# Patient Record
Sex: Male | Born: 1960 | Race: Black or African American | Hispanic: No | Smoking: Current every day smoker
Health system: Southern US, Community
[De-identification: ages and names within clinical notes are randomized; demographics above are authoritative.]

## PROBLEM LIST (undated history)

## (undated) DIAGNOSIS — F1010 Alcohol abuse, uncomplicated: Secondary | ICD-10-CM

## (undated) DIAGNOSIS — F1910 Other psychoactive substance abuse, uncomplicated: Secondary | ICD-10-CM

## (undated) HISTORY — PX: JOINT REPLACEMENT: SHX530

## (undated) HISTORY — PX: HIP OPEN REDUCTION: SHX1755

## (undated) HISTORY — PX: APPENDECTOMY: SHX54

---

## 2004-11-09 ENCOUNTER — Inpatient Hospital Stay (HOSPITAL_COMMUNITY): Admission: RE | Admit: 2004-11-09 | Discharge: 2004-11-13 | Payer: Self-pay | Admitting: Orthopedic Surgery

## 2012-05-13 ENCOUNTER — Inpatient Hospital Stay (HOSPITAL_COMMUNITY)
Admission: AD | Admit: 2012-05-13 | Discharge: 2012-05-20 | DRG: 897 | Disposition: A | Discharge status: Home / Self Care | Payer: Medicaid Other | Source: Ambulatory Visit | Attending: Psychiatry | Admitting: Psychiatry

## 2012-05-13 ENCOUNTER — Encounter (HOSPITAL_COMMUNITY): Payer: Self-pay | Admitting: Emergency Medicine

## 2012-05-13 ENCOUNTER — Emergency Department (HOSPITAL_COMMUNITY)
Admission: EM | Admit: 2012-05-13 | Discharge: 2012-05-13 | Disposition: A | Discharge status: Home / Self Care | Payer: Medicare Other | Attending: Emergency Medicine | Admitting: Emergency Medicine

## 2012-05-13 ENCOUNTER — Encounter (HOSPITAL_COMMUNITY): Payer: Self-pay

## 2012-05-13 DIAGNOSIS — F1220 Cannabis dependence, uncomplicated: Secondary | ICD-10-CM | POA: Diagnosis present

## 2012-05-13 DIAGNOSIS — F1020 Alcohol dependence, uncomplicated: Principal | ICD-10-CM | POA: Diagnosis present

## 2012-05-13 DIAGNOSIS — F1910 Other psychoactive substance abuse, uncomplicated: Secondary | ICD-10-CM | POA: Insufficient documentation

## 2012-05-13 DIAGNOSIS — Z79899 Other long term (current) drug therapy: Secondary | ICD-10-CM

## 2012-05-13 DIAGNOSIS — F17200 Nicotine dependence, unspecified, uncomplicated: Secondary | ICD-10-CM | POA: Insufficient documentation

## 2012-05-13 DIAGNOSIS — F329 Major depressive disorder, single episode, unspecified: Secondary | ICD-10-CM | POA: Diagnosis present

## 2012-05-13 DIAGNOSIS — R45850 Homicidal ideations: Secondary | ICD-10-CM

## 2012-05-13 DIAGNOSIS — F1210 Cannabis abuse, uncomplicated: Secondary | ICD-10-CM | POA: Insufficient documentation

## 2012-05-13 HISTORY — DX: Alcohol abuse, uncomplicated: F10.10

## 2012-05-13 HISTORY — DX: Other psychoactive substance abuse, uncomplicated: F19.10

## 2012-05-13 LAB — COMPREHENSIVE METABOLIC PANEL
Alkaline Phosphatase: 77 U/L (ref 39–117)
BUN: 10 mg/dL (ref 6–23)
CO2: 23 mEq/L (ref 19–32)
Calcium: 9.4 mg/dL (ref 8.4–10.5)
GFR calc Af Amer: 84 mL/min — ABNORMAL LOW (ref >90)
Glucose, Bld: 91 mg/dL (ref 70–99)
Sodium: 135 mEq/L (ref 135–145)
Total Bilirubin: 0.5 mg/dL (ref 0.3–1.2)
Total Protein: 7.4 g/dL (ref 6.0–8.3)

## 2012-05-13 LAB — RAPID URINE DRUG SCREEN, HOSP PERFORMED
Amphetamines: NOT DETECTED
Benzodiazepines: NOT DETECTED
Opiates: NOT DETECTED

## 2012-05-13 LAB — CBC
MCH: 31.1 pg (ref 26.0–34.0)
MCHC: 35.6 g/dL (ref 30.0–36.0)
Platelets: 231 10*3/uL (ref 150–400)

## 2012-05-13 LAB — ACETAMINOPHEN LEVEL: Acetaminophen (Tylenol), Serum: <15 ug/mL (ref 10–30)

## 2012-05-13 MED ORDER — CHLORDIAZEPOXIDE HCL 25 MG PO CAPS
25.0000 mg | ORAL_CAPSULE | Freq: Four times a day (QID) | ORAL | Status: AC
  Administered 2012-05-13 – 2012-05-15 (×6): 25 mg via ORAL
  Filled 2012-05-13 (×5): qty 1

## 2012-05-13 MED ORDER — CHLORDIAZEPOXIDE HCL 25 MG PO CAPS
25.0000 mg | ORAL_CAPSULE | Freq: Once | ORAL | Status: AC
  Administered 2012-05-13: 25 mg via ORAL

## 2012-05-13 MED ORDER — ZOLPIDEM TARTRATE 5 MG PO TABS: 5.0000 mg | ORAL_TABLET | Freq: Every evening | ORAL | Status: DC | PRN

## 2012-05-13 MED ORDER — LORAZEPAM 1 MG PO TABS: 1.0000 mg | ORAL_TABLET | Freq: Three times a day (TID) | ORAL | Status: DC | PRN

## 2012-05-13 MED ORDER — THIAMINE HCL 100 MG/ML IJ SOLN
100.0000 mg | Freq: Once | INTRAMUSCULAR | Status: AC
  Administered 2012-05-13: 100 mg via INTRAMUSCULAR

## 2012-05-13 MED ORDER — CHLORDIAZEPOXIDE HCL 25 MG PO CAPS
25.0000 mg | ORAL_CAPSULE | Freq: Three times a day (TID) | ORAL | Status: AC
  Administered 2012-05-15 – 2012-05-16 (×2): 25 mg via ORAL
  Filled 2012-05-13 (×2): qty 1

## 2012-05-13 MED ORDER — VITAMIN B-1 100 MG PO TABS
100.0000 mg | ORAL_TABLET | Freq: Every day | ORAL | Status: DC
  Administered 2012-05-14 – 2012-05-20 (×7): 100 mg via ORAL
  Filled 2012-05-13 (×8): qty 1

## 2012-05-13 MED ORDER — CHLORDIAZEPOXIDE HCL 25 MG PO CAPS
ORAL_CAPSULE | ORAL | Status: AC
  Administered 2012-05-13: 25 mg via ORAL
  Filled 2012-05-13: qty 1

## 2012-05-13 MED ORDER — ONDANSETRON 4 MG PO TBDP
4.0000 mg | ORAL_TABLET | Freq: Four times a day (QID) | ORAL | Status: AC | PRN
  Administered 2012-05-14 – 2012-05-15 (×2): 4 mg via ORAL
  Filled 2012-05-13: qty 1

## 2012-05-13 MED ORDER — MAGNESIUM HYDROXIDE 400 MG/5ML PO SUSP: 30.0000 mL | Freq: Every day | ORAL | Status: DC | PRN

## 2012-05-13 MED ORDER — TRAZODONE HCL 50 MG PO TABS
50.0000 mg | ORAL_TABLET | Freq: Every evening | ORAL | Status: DC | PRN
  Administered 2012-05-13 – 2012-05-15 (×3): 50 mg via ORAL
  Filled 2012-05-13 (×9): qty 1

## 2012-05-13 MED ORDER — LOPERAMIDE HCL 2 MG PO CAPS: 2.0000 mg | ORAL_CAPSULE | ORAL | Status: AC | PRN

## 2012-05-13 MED ORDER — HYDROXYZINE HCL 25 MG PO TABS
25.0000 mg | ORAL_TABLET | Freq: Four times a day (QID) | ORAL | Status: AC | PRN
  Administered 2012-05-16: 25 mg via ORAL

## 2012-05-13 MED ORDER — ALUM & MAG HYDROXIDE-SIMETH 200-200-20 MG/5ML PO SUSP: 30.0000 mL | ORAL | Status: DC | PRN

## 2012-05-13 MED ORDER — ACETAMINOPHEN 325 MG PO TABS
650.0000 mg | ORAL_TABLET | Freq: Four times a day (QID) | ORAL | Status: DC | PRN
  Administered 2012-05-14 – 2012-05-18 (×2): 650 mg via ORAL

## 2012-05-13 MED ORDER — CHLORDIAZEPOXIDE HCL 25 MG PO CAPS
ORAL_CAPSULE | ORAL | Status: AC
  Filled 2012-05-13: qty 1

## 2012-05-13 MED ORDER — CHLORDIAZEPOXIDE HCL 25 MG PO CAPS
25.0000 mg | ORAL_CAPSULE | Freq: Every day | ORAL | Status: AC
  Administered 2012-05-15 – 2012-05-18 (×2): 25 mg via ORAL

## 2012-05-13 MED ORDER — CHLORDIAZEPOXIDE HCL 25 MG PO CAPS
25.0000 mg | ORAL_CAPSULE | ORAL | Status: AC
  Administered 2012-05-16 – 2012-05-17 (×2): 25 mg via ORAL
  Filled 2012-05-13 (×3): qty 1

## 2012-05-13 MED ORDER — ONDANSETRON HCL 4 MG PO TABS: 4.0000 mg | ORAL_TABLET | Freq: Three times a day (TID) | ORAL | Status: DC | PRN

## 2012-05-13 MED ORDER — ADULT MULTIVITAMIN W/MINERALS CH
1.0000 | ORAL_TABLET | Freq: Every day | ORAL | Status: DC
  Administered 2012-05-14 – 2012-05-20 (×7): 1 via ORAL
  Filled 2012-05-13 (×8): qty 1

## 2012-05-13 MED ORDER — NICOTINE 21 MG/24HR TD PT24
21.0000 mg | MEDICATED_PATCH | Freq: Every day | TRANSDERMAL | Status: DC
  Administered 2012-05-14 – 2012-05-18 (×5): 21 mg via TRANSDERMAL
  Filled 2012-05-13 (×8): qty 1

## 2012-05-13 NOTE — Tx Team (Signed)
Initial Interdisciplinary Treatment Plan  PATIENT STRENGTHS: (choose at least two) Ability for insight Average or above average intelligence Communication skills General fund of knowledge Motivation for treatment/growth  PATIENT STRESSORS: Substance abuse   PROBLEM LIST: Problem List/Patient Goals Date to be addressed Date deferred Reason deferred Estimated date of resolution  Substance Abuse      Homicidal Ideation                                                 DISCHARGE CRITERIA:  Motivation to continue treatment in a less acute level of care  PRELIMINARY DISCHARGE PLAN: Outpatient therapy  PATIENT/FAMIILY INVOLVEMENT: This treatment plan has been presented to and reviewed with the patient, William Cooper, and/or family member.  The patient and family have been given the opportunity to ask questions and make suggestions.  Gretta Arab [facility] 05/13/2012, 10:35 PM

## 2012-05-13 NOTE — BH Assessment (Signed)
Assessment Note   William Cooper is an 51 y.o. male who presents to the ED requesting detox. Patient shared that he is ready for detox and treatment. CSW met with patient at bedside. Pt was very lethargic and sleepy. Pt was alert and oriented x 4, however pt did not open his eyes during assessment. Pt was not fully engaged in conversation and replied "I don't know to a lot of questions".   Pt shared that he uses alcohol, cocaine, and weed on a daily basis, and has so for months. Pt stated he was sober for 5-7 years. Pt shared that he began using drugs again when he returned to selling. Pt shared this morning he drank 6 beers, less than a gram of crack cocaine, and a blunt of marijuana. Pt states this is a usual amount, sometimes more depending.   Pt denies SI, HI, AH, VH. Pt reported history of SI and HI in years past but denies at this time.  Pt shares he has a history of depression and at what time was prescribed Wellbutrin. Pt states he has not been compliant with medications of follow up care from a psychiatrist in years. Pt stated he doesn't remember who his provider was. Pt stated it been years ago that he took medication. Pt reports trouble sleeping, but no further symptoms of depression. Pt shares he has a history of depression and a suicide attempt by overdosing on drugs a few years ago. Pt reports that pt received inpatient treatment at [facility] and a few other places but pt can not recall.   Pt shares he lives in an apartment and currently receives SSI for a back injury. Pt shares he doesn't have any family or friends as a support.   Axis I: Polysubstance Dependence  Axis II: Deferred Axis III:  Past Medical History  Diagnosis Date  . Alcohol abuse   . Substance abuse    Axis IV: other psychosocial or environmental problems and problems related to social environment Axis V: 41-50 serious symptoms  Past Medical History:  Past Medical History  Diagnosis Date  . Alcohol  abuse   . Substance abuse     Past Surgical History  Procedure Date  . Joint replacement     Family History: History reviewed. No pertinent family history.  Social History:  reports that he has been smoking Cigarettes.  He has been smoking about .5 packs per day. He does not have any smokeless tobacco history on file. He reports that he drinks alcohol. He reports that he uses illicit drugs (Marijuana and Cocaine).  Additional Social History:  Alcohol / Drug Use History of alcohol / drug use?: Yes Substance #1 Name of Substance 1: Alcohol  1 - Age of First Use: 31-8 years old  1 - Amount (size/oz): 6 beers or more  1 - Frequency: daily  1 - Duration: months 1 - Last Use / Amount: 6 beers this morning  Substance #2 Name of Substance 2: crack  2 - Age of First Use: 20's  2 - Amount (size/oz): less than a gram sometimes more  2 - Frequency: daily  2 - Duration: months 2 - Last Use / Amount: less than a gram this morning  Substance #3 Name of Substance 3: THC  3 - Age of First Use: [age] years old 3 - Amount (size/oz): blunt 3 - Frequency: daily 3 - Duration: months 3 - Last Use / Amount: this morning, blunt   CIWA: CIWA-Ar BP: 155/95  mmHg Pulse Rate: 58  COWS:    Allergies:  Allergies  Allergen Reactions  . Morphine And Related Rash    Home Medications:  (Not in a hospital admission)  OB/GYN Status:  No LMP for male patient.  General Assessment Data Location of Assessment: [facility] ED Living Arrangements: Alone Can pt return to current living arrangement?: Yes Admission Status: Voluntary Is patient capable of signing voluntary admission?: Yes Transfer from: Home Referral Source: Self/Family/Friend  Education Status Is patient currently in school?: No  Risk to self Suicidal Ideation: No Suicidal Intent: No Is patient at risk for suicide?: No What has been your use of drugs/alcohol within the last 12 months?:  (uses drugs and alcohol daily ) Previous  Attempts/Gestures: Yes (years ago ) How many times?: 1  Triggers for Past Attempts: Unknown Intentional Self Injurious Behavior: None Family Suicide History: Unknown Recent stressful life event(s):  ("Idk everything" ) Persecutory voices/beliefs?: No Depression: Yes Depression Symptoms: Insomnia;Feeling angry/irritable Substance abuse history and/or treatment for substance abuse?: Yes  Risk to Others Homicidal Ideation: No-Not Currently/Within Last 6 Months Thoughts of Harm to Others: No-Not Currently Present/Within Last 6 Months Current Homicidal Intent: No-Not Currently/Within Last 6 Months Identified Victim: n/a  History of harm to others?:  (has had HI thoughts in the past ) Assessment of Violence: None Noted Violent Behavior Description: none Does patient have access to weapons?: Yes (Comment) Criminal Charges Pending?: No Does patient have a court date: No  Psychosis Hallucinations: None noted Delusions: None noted  Mental Status Report Appear/Hygiene: Other (Comment) (very sleepy and lethargic) Eye Contact: Poor (patient did not open eyes during assessment ) Motor Activity: Unremarkable Speech: Soft;Slow Level of Consciousness: Quiet/awake Mood: Apathetic Affect: Apathetic Anxiety Level: Minimal Thought Processes: Coherent;Relevant Judgement: Impaired Orientation: Person;Place;Time;Situation Obsessive Compulsive Thoughts/Behaviors: None  Cognitive Functioning Concentration: Normal Memory: Recent Intact;Remote Intact IQ: Average Insight: Poor Impulse Control: Poor Appetite: Poor Weight Loss: 15  Sleep: Decreased Total Hours of Sleep: 2  Vegetative Symptoms: None  ADLScreening [facility]) Patient's cognitive ability adequate to safely complete daily activities?: Yes Patient able to express need for assistance with ADLs?: Yes Independently performs ADLs?: Yes (appropriate for developmental age)  Abuse/Neglect [facility]) Physical Abuse:  Denies Verbal Abuse: Denies Sexual Abuse: Denies  Prior Inpatient Therapy Prior Inpatient Therapy: Yes Prior Therapy Dates: uknown Prior Therapy Facilty/Provider(s): [facility], other, IDK  Reason for Treatment: substance abuse/ depression   Prior Outpatient Therapy Prior Outpatient Therapy: Yes Prior Therapy Dates: unknown Prior Therapy Facilty/Provider(s): not current  Reason for Treatment: depression  ADL Screening (condition at time of admission) Patient's cognitive ability adequate to safely complete daily activities?: Yes Patient able to express need for assistance with ADLs?: Yes Independently performs ADLs?: Yes (appropriate for developmental age)       Abuse/Neglect Assessment (Assessment to be complete while patient is alone) Physical Abuse: Denies Verbal Abuse: Denies Sexual Abuse: Denies Values / Beliefs Cultural Requests During Hospitalization: None Spiritual Requests During Hospitalization: None        Additional Information 1:1 In Past 12 Months?: No CIRT Risk: No Elopement Risk: No Does patient have medical clearance?: Yes     Disposition:  Disposition Disposition of Patient: Inpatient treatment program  On Site Evaluation by:   Reviewed with Physician:     Catha Gosselin A 05/13/2012 7:57 PM

## 2012-05-13 NOTE — ED Notes (Signed)
Pt alert and oriented x4. Respirations even and unlabored, bilateral symmetrical rise and fall of chest. Skin warm and dry. In no acute distress. Denies needs.   

## 2012-05-13 NOTE — ED Notes (Signed)
Pt vitals taken at arrival and within normal limits - did not appear in computer.

## 2012-05-13 NOTE — ED Notes (Signed)
Pt requesting detox from "alcohol, weed, crack, pills, and women".  Last used all of them together today "this morning".

## 2012-05-13 NOTE — Progress Notes (Signed)
Pt accepted to [facility] 301-2 to Dr. Dub Mikes. Pt RN and EDP informed. CSW completed support paperwork and sent to [facility].   Catha Gosselin, LCSWA  [phone] .05/13/2012 8:51pm

## 2012-05-13 NOTE — Progress Notes (Signed)
Patient ID: William Cooper, male   DOB: 1960-08-15, 51 y.o.   MRN: [id number] Pt denies SI/AVH. Pt endorses HI with no plan, but refuses to comment on who the HI is toward. When asked about the HI he states "that's why I am here." Pt admitted voluntarily today for ETOH detox. Pt states that he has been drinking since he was a teenager, however he has had moments of sobriety with his longest time being 7 years.  Pt states that he relapsed 1 year ago and currently drinks 6 beers daily. Pt irritable on admit. Pt states that his mother and brother both passed away in 11/13/10 within 2 months of each other. Pt states that for the past 2 weeks he has been experiencing blood in his stool. Pt also states that over the past month he has lost 15 pounds due to poor appetite. Pt is disabled. UDS positive for THC. BAL was 19.

## 2012-05-13 NOTE — ED Provider Notes (Signed)
History     CSN: [id number] Arrival date & time 05/13/12  1551 First MD Initiated Contact with Patient 05/13/12 1756    Chief Complaint  Patient presents with  . Medical Clearance   HPI The patient presents to the emergency room requesting alcohol detox. Patient states he drinks 7 or 8 beers per day. He also smokes marijuana and uses crack. He also takes Xanax. Patient states she spoke to a center and was told to come to emergency room to be cleared. He denies any suicidal or homicidal ideation. Patient states he's been drinking like this for a long period of time.  He currently does not work. He resides in [place]. Past Medical History  Diagnosis Date  . Alcohol abuse   . Substance abuse     Past Surgical History  Procedure Date  . Joint replacement     History reviewed. No pertinent family history.  History  Substance Use Topics  . Smoking status: Current Every Day Smoker -- 0.5 packs/day    Types: Cigarettes  . Smokeless tobacco: Not on file  . Alcohol Use: Yes     Comment: 3-4 beers a day      Review of Systems  All other systems reviewed and are negative.    Allergies  Morphine and related  Home Medications  No current outpatient prescriptions on file.  BP 155/95  Pulse 58  Temp 98.6 F (37 C) (Oral)  Resp 18  SpO2 100%  Physical Exam  Nursing note and vitals reviewed. Constitutional: He appears well-developed and well-nourished. No distress.  HENT:  Head: Normocephalic and atraumatic.  Right Ear: External ear normal.  Left Ear: External ear normal.  Eyes: Conjunctivae normal are normal. Right eye exhibits no discharge. Left eye exhibits no discharge. No scleral icterus.  Neck: Neck supple. No tracheal deviation present.  Cardiovascular: Normal rate, regular rhythm and intact distal pulses.   Pulmonary/Chest: Effort normal and breath sounds normal. No stridor. No respiratory distress. He has no wheezes. He has no rales.  Abdominal: Soft. Bowel  sounds are normal. He exhibits no distension. There is no tenderness. There is no rebound and no guarding.  Musculoskeletal: He exhibits no edema and no tenderness.  Neurological: He is alert. He has normal strength. No sensory deficit. Cranial nerve deficit:  no gross defecits noted. He exhibits normal muscle tone. He displays no seizure activity. Coordination normal.  Skin: Skin is warm and dry. No rash noted.  Psychiatric: He has a normal mood and affect.    ED Course  Procedures (including critical care time)  Labs Reviewed  COMPREHENSIVE METABOLIC PANEL - Abnormal; Notable for the following:    GFR calc non Af Amer 72 (*)     GFR calc Af Amer 84 (*)     All other components within normal limits  ETHANOL - Abnormal; Notable for the following:    Alcohol, Ethyl (B) 19 (*)     All other components within normal limits  SALICYLATE LEVEL - Abnormal; Notable for the following:    Salicylate Lvl <2.0 (*)     All other components within normal limits  URINE RAPID DRUG SCREEN (HOSP PERFORMED) - Abnormal; Notable for the following:    Tetrahydrocannabinol POSITIVE (*)     All other components within normal limits  CBC  ACETAMINOPHEN LEVEL   No results found.    MDM   The patient is hemodynamically stable in the ED. He is medically cleared. We'll attempt to assist him with  getting treatment for his substance abuse.       Celene Kras, MD 05/13/12 [phone]

## 2012-05-13 NOTE — ED Notes (Signed)
Pt in blue scrubs - wanded - requested urine, pt said not able to produce at this time - belongings up front in triage: 1 rolling duffle bag(full), 1 bag with tee shirt, hat, underpants, shoes, jeans.

## 2012-05-14 ENCOUNTER — Encounter (HOSPITAL_COMMUNITY): Payer: Self-pay | Admitting: Psychiatry

## 2012-05-14 DIAGNOSIS — F1010 Alcohol abuse, uncomplicated: Secondary | ICD-10-CM

## 2012-05-14 DIAGNOSIS — F1020 Alcohol dependence, uncomplicated: Secondary | ICD-10-CM | POA: Diagnosis present

## 2012-05-14 DIAGNOSIS — F1910 Other psychoactive substance abuse, uncomplicated: Secondary | ICD-10-CM

## 2012-05-14 DIAGNOSIS — F329 Major depressive disorder, single episode, unspecified: Secondary | ICD-10-CM | POA: Diagnosis present
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
## 2012-05-14 NOTE — Progress Notes (Signed)
Patient ID: William Cooper, male   DOB: 10-Sep-1960, 51 y.o.   MRN: [id number] He has been in bed in AM and was up and to groups this afternoon. He refused to fill out his self inventory and has not  c/o withdrawal symptoms nor has he said anything about there being blood in his stool today.

## 2012-05-14 NOTE — Progress Notes (Signed)
Psychoeducational Group Note  Date:  05/14/2012 Time: 2000 Group Topic/Focus:  AA group  Participation Level:  Active  Participation Quality:  Appropriate  Affect:  Appropriate  Cognitive:  Appropriate  Insight:  Good  Engagement in Group:  Good  Additional Comments:    William Cooper 05/14/2012, 10:35 PM

## 2012-05-14 NOTE — Progress Notes (Signed)
Nutrition Brief Note  Patient identified on the Malnutrition Screening Tool (MST) Report  Body mass index is 30.21 kg/(m^2). Pt meets criteria for overweight based on current BMI.   Current diet order is Regular, patient is consuming <50% of meals at this time per his report. Labs and medications reviewed.   Pt reports diarrhea.  He recently had a bleeding ulcer and his intake has been poor since due to diarrhea.  Pt was started on lomotil this am. Encouraged small frequent meals and beverages to prevent dehydration.  No additional nutrition interventions warranted at this time. If nutrition issues arise, please consult RD.   Loyce Dys, MS RD LDN Clinical Inpatient Dietitian Pager: [phone] Weekend/After hours pager: [phone]
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
## 2012-05-14 NOTE — Clinical Social Work Note (Signed)
Aftercare Planning Group: 05/14/2012 9:45 AM  Pt attended discharge planning group and actively participated in group.  CSW provided pt with today's workbook.  Pt presents with flat affect and depressed mood.  Pt states that he came to the hospital because he was having thoughts of hurting someone.  Pt states that he uses alcohol and marijuana and wants to go to long term treatment.  CSW will assess for appropriate referrals.  Pt states that he lives in [place] alone.  No further needs voiced by pt at this time.    [facility] Group Note : Clinical Social Worker Group Therapy  05/14/2012  1:15 PM  Type of Therapy:  Group Therapy - Process Group  Participation Level:  Appropriate  Participation Quality:  Appropriate   Affect:  Anxious, agitated  Cognitive:  Alert  Insight:  Limited, superficial  Engagement in Group:  Limited  Engagement in Therapy:  Limited  Modes of Intervention:  Support  Summary of Progress/Problems: The topic of today's group was emotional regulation.  Patient was active participant in group.  Discussed wanting to get help for himself and not expecting others to do it for him.  Patient discussed the importance of taking responsibility for one's own sobriety.  Chelsea Horton, LCSWA 05/14/2012 3:00 pm y
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
## 2012-05-14 NOTE — Tx Team (Signed)
Interdisciplinary Treatment Plan Update (Adult)  Date:  05/14/2012  Time Reviewed:  10:05 AM   Progress in Treatment: Attending groups: Yes Participating in groups:  Yes Taking medication as prescribed: Yes Tolerating medication:  Yes Family/Significant othe contact made: No, pt refused Patient understands diagnosis:  Yes Discussing patient identified problems/goals with staff:  Yes Medical problems stabilized or resolved:  Yes Denies suicidal/homicidal ideation: Yes Issues/concerns per patient self-inventory:  None identified Other: N/A  New problem(s) identified: None Identified  Reason for Continuation of Hospitalization: Anxiety Depression Medication stabilization  Interventions implemented related to continuation of hospitalization: mood stabilization, medication monitoring and adjustment, group therapy and psycho education, suicide risk assessment, collateral contact, aftercare planning, ongoing physician assessments and safety checks q 15 mins  Additional comments: N/A  Estimated length of stay: 3-5 days  Discharge Plan: CSW is assessing for appropriate referrals for long term treatment.    New goal(s): N/A  Review of initial/current patient goals per problem list:    1.  Goal(s): Address substance use by completing detox protocol  Met:  No  Target date: 4 days  As evidenced by: 05/19/12  2.  Goal (s): Reduce depressive symptoms from a 10 to a 3  Met:  No  Target date: 3-5 days  As evidenced by: Pt rates at a   3.  Goal (s): Reduce anxiety symptoms from a 10 to a 3  Met:  No  Target date:  3-5 days  As evidenced by: Pt rates at a     Attendees: Patient:     Family:     Physician: Geoffery Lyons, MD 05/14/2012 10:05 AM   Nursing: Roswell Miners, RN 05/14/2012 10:05 AM   Clinical Social Worker:  Reyes Ivan, LCSWA 05/14/2012  10:05 AM   Other: Burnetta Sabin, RN 05/14/2012  10:05 AM   Other:  Nanine Means, NP 05/14/2012 10:07 AM   Other:  Bubba Camp, Psyc intern 05/14/2012 10:07 AM   Other:     Other:      Scribe for Treatment Team:   Reyes Ivan 05/14/2012 10:05 AM

## 2012-05-15 NOTE — Progress Notes (Signed)
Patient ID: William Cooper, male   DOB: 08-31-1960, 51 y.o.   MRN: [id number] He has been up and to group interacting with peers. Self inventory:Denies depression Not hopeless, denies SI thoughts.

## 2012-05-15 NOTE — Progress Notes (Signed)
[facility] MD Progress Note  05/15/2012 11:36 AM William Cooper  MRN:  [id number]  Diagnosis:  Alcohol Dependence, Mood Disorder NOS, Impulse Control NOS  ADL's:  Intact  Sleep: Poor  Appetite:  Fair  Suicidal Ideation:  Plan:  get in a situation when he kills these guys andbe killed Intent:  Denies Means:  Denies Homicidal Ideation:  Plan:  GO and get them Intent:  Denies Means:  Denies  Having a hard time. He woke up with a dream in which he was "chasing these boys." He admits he is very angry. In the dream he relieved the event and saw himself shooting them. Does not want to hurt anyone, and afraid he is going to do it. Wants help to deal with his temper Usually the temper is there but he can control it, when he drinks he cant control it. Admits he gets frustrated a lot. It was easier to walk away not now specially when he is using  Mental Status Examination/Evaluation: Objective:  Appearance: Fairly Groomed and Guarded  Eye Contact::  Minimal  Speech:  Clear and Coherent and Slow  Volume:  Decreased  Mood:  Anxious, Depressed and Irritable  Affect:  Restricted  Thought Process:  Coherent and Goal Directed  Orientation:  Full  Thought Content:  Rumination  Suicidal Thoughts:  Yes.  with intent/plan  Homicidal Thoughts:  Yes.  with intent/plan  Memory:  Immediate;   Fair Recent;   Fair Remote;   Fair  Judgement:  Impaired  Insight:  Shallow  Psychomotor Activity:  Normal  Concentration:  Fair  Recall:  Fair  Akathisia:  No  Handed:  Right  AIMS (if indicated):     Assets:  Desire for Improvement  Sleep:  Number of Hours: 6.5    Vital Signs:Blood pressure 126/82, pulse 80, temperature 97.8 F (36.6 C), temperature source Oral, resp. rate 18, height 5\' 4"  (1.626 m), weight 79.833 kg (176 lb). Current Medications: Current Facility-Administered Medications  Medication Dose Route Frequency Provider Last Rate Last Dose  . acetaminophen (TYLENOL) tablet 650 mg  650 mg Oral  Q6H PRN Kerry Hough, PA   650 mg at 05/14/12 1314  . [COMPLETED] chlordiazePOXIDE (LIBRIUM) capsule 25 mg  25 mg Oral QID Kerry Hough, PA   25 mg at 05/15/12 0802   Followed by  . chlordiazePOXIDE (LIBRIUM) capsule 25 mg  25 mg Oral TID Kerry Hough, PA       Followed by  . chlordiazePOXIDE (LIBRIUM) capsule 25 mg  25 mg Oral BH-qamhs Kerry Hough, PA       Followed by  . chlordiazePOXIDE (LIBRIUM) capsule 25 mg  25 mg Oral Daily Kerry Hough, PA      . hydrOXYzine (ATARAX/VISTARIL) tablet 25 mg  25 mg Oral Q6H PRN Kerry Hough, PA      . loperamide (IMODIUM) capsule 2-4 mg  2-4 mg Oral PRN Kerry Hough, PA      . magnesium hydroxide (MILK OF MAGNESIA) suspension 30 mL  30 mL Oral Daily PRN Kerry Hough, PA      . multivitamin with minerals tablet 1 tablet  1 tablet Oral Daily Kerry Hough, PA   1 tablet at 05/15/12 0802  . nicotine (NICODERM CQ - dosed in mg/24 hours) patch 21 mg  21 mg Transdermal Q0600 Kerry Hough, PA   21 mg at 05/15/12 0657  . ondansetron (ZOFRAN-ODT) disintegrating tablet 4 mg  4 mg Oral Q6H  PRN Kerry Hough, PA   4 mg at 05/14/12 0748  . thiamine (VITAMIN B-1) tablet 100 mg  100 mg Oral Daily Kerry Hough, PA   100 mg at 05/15/12 0802  . traZODone (DESYREL) tablet 50 mg  50 mg Oral QHS,MR X 1 Kerry Hough, PA   50 mg at 05/14/12 2109    Lab Results:  Results for orders placed during the hospital encounter of 05/13/12 (from the past 48 hour(s))  CBC     Status: Normal   Collection Time   05/13/12  4:55 PM      Component Value Range Comment   WBC 6.7  4.0 - 10.5 K/uL    RBC 5.05  4.22 - 5.81 MIL/uL    Hemoglobin 15.7  13.0 - 17.0 g/dL    HCT 21.3  08.6 - 57.8 %    MCV 87.3  78.0 - 100.0 fL    MCH 31.1  26.0 - 34.0 pg    MCHC 35.6  30.0 - 36.0 g/dL    RDW 46.9  62.9 - 52.8 %    Platelets 231  150 - 400 K/uL   COMPREHENSIVE METABOLIC PANEL     Status: Abnormal   Collection Time   05/13/12  4:55 PM      Component Value  Range Comment   Sodium 135  135 - 145 mEq/L    Potassium 3.8  3.5 - 5.1 mEq/L    Chloride 99  96 - 112 mEq/L    CO2 23  19 - 32 mEq/L    Glucose, Bld 91  70 - 99 mg/dL    BUN 10  6 - 23 mg/dL    Creatinine, Ser 4.13  0.50 - 1.35 mg/dL    Calcium 9.4  8.4 - 24.4 mg/dL    Total Protein 7.4  6.0 - 8.3 g/dL    Albumin 3.8  3.5 - 5.2 g/dL    AST 23  0 - 37 U/L    ALT 17  0 - 53 U/L    Alkaline Phosphatase 77  39 - 117 U/L    Total Bilirubin 0.5  0.3 - 1.2 mg/dL    GFR calc non Af Amer 72 (*) >90 mL/min    GFR calc Af Amer 84 (*) >90 mL/min   ETHANOL     Status: Abnormal   Collection Time   05/13/12  4:55 PM      Component Value Range Comment   Alcohol, Ethyl (B) 19 (*) 0 - 11 mg/dL   SALICYLATE LEVEL     Status: Abnormal   Collection Time   05/13/12  4:55 PM      Component Value Range Comment   Salicylate Lvl <2.0 (*) 2.8 - 20.0 mg/dL   ACETAMINOPHEN LEVEL     Status: Normal   Collection Time   05/13/12  4:55 PM      Component Value Range Comment   Acetaminophen (Tylenol), Serum <15.0  10 - 30 ug/mL   URINE RAPID DRUG SCREEN (HOSP PERFORMED)     Status: Abnormal   Collection Time   05/13/12  5:28 PM      Component Value Range Comment   Opiates NONE DETECTED  NONE DETECTED    Cocaine NONE DETECTED  NONE DETECTED    Benzodiazepines NONE DETECTED  NONE DETECTED    Amphetamines NONE DETECTED  NONE DETECTED    Tetrahydrocannabinol POSITIVE (*) NONE DETECTED    Barbiturates NONE DETECTED  NONE DETECTED  Physical Findings: AIMS: Facial and Oral Movements Muscles of Facial Expression: None, normal Lips and Perioral Area: None, normal Jaw: None, normal Tongue: None, normal,Extremity Movements Upper (arms, wrists, hands, fingers): None, normal Lower (legs, knees, ankles, toes): None, normal, Trunk Movements Neck, shoulders, hips: None, normal, Overall Severity Severity of abnormal movements (highest score from questions above): None, normal Incapacitation due to abnormal  movements: None, normal Patient's awareness of abnormal movements (rate only patient's report): No Awareness, Dental Status Current problems with teeth and/or dentures?: No Does patient usually wear dentures?: No  CIWA:  CIWA-Ar Total: 0  COWS:     Treatment Plan Summary: Daily contact with patient to assess and evaluate symptoms and progress in treatment Medication management  Plan: Complete detox           Discuss starting a mood stabilizer  Aniyah Nobis A 05/15/2012, 11:36 AM

## 2012-05-15 NOTE — Clinical Social Work Note (Signed)
Aftercare Planning Group: 05/15/2012 9:45 AM  Pt attended discharge planning group and actively participated in group.  CSW provided pt with today's workbook.  Pt presents with flat affect and depressed mood.  Pt rates depression and anxiety as high.  Pt states that he wants to go straight to treatment from here.  CSW suggested [facility] but pt refuses to go to [place].  Pt is unable to go to [facility] due to having [facility].  CSW will continue to assess for appropriate referrals.  No further needs voiced by pt at this time.  Safety planning and suicide prevention discussed.  Pt participated in discussion and acknowledged an understanding of the information provided.       [facility] Group Note : Clinical Social Worker Group Therapy  05/15/2012  1:15 PM  Type of Therapy:  Group Therapy - Process Group  Participation Level: monopolozing  Participation Quality:  Easily agitated  Affect:  Flat and Irritable  Cognitive:  Alert  Insight:  None  Engagement in Group:  Limited  Engagement in Therapy:  Limited  Modes of Intervention:  Support  Summary of Progress/Problems: The topic of today's group was maintaining balance.  Patient was irritable easily agitated. Minimized AA as alcohol was not his drug of choice.  Discussed need to relate to someone who had the experience of a narcotic in order to be able to help him.  Patient was resistant to the idea that an addiction is an addiction no matter what the drug.  Remained stuck in his past behaviors as a drug addict and was unable to discuss solutions.  Chelsea Horton, LCSWA 05/15/2012 3:00 pm

## 2012-05-15 NOTE — Progress Notes (Signed)
Patient ID: William Cooper, male   DOB: 1961-06-06, 51 y.o.   MRN: [id number] He has been up and to groups more today.  He denies  Thoughts of depression and SI.   Has been interacting with peers and staff.

## 2012-05-15 NOTE — Progress Notes (Signed)
Patient ID: William Cooper, male   DOB: 04-Dec-1960, 51 y.o.   MRN: [id number]  D:  Writer asked the pt how his hospital stay is going thus far. Pt stated, "Tired of these people telling me what to do".  Writer asked pt what made him decide to come for help. Pt stated, "Clean for 7 yrs and fucked up". Pt was walking away as he responded, as if to say he was finished talking.   A:  Offered support and encouragement.  15 min checks continued for safety.  R: Pt remains safe.

## 2012-05-16 MED ORDER — MIRTAZAPINE 30 MG PO TABS
30.0000 mg | ORAL_TABLET | Freq: Every day | ORAL | Status: DC
  Administered 2012-05-16 – 2012-05-17 (×2): 30 mg via ORAL
  Filled 2012-05-16 (×4): qty 1

## 2012-05-16 MED ORDER — GABAPENTIN 300 MG PO CAPS
300.0000 mg | ORAL_CAPSULE | Freq: Three times a day (TID) | ORAL | Status: DC
  Administered 2012-05-16 – 2012-05-17 (×3): 300 mg via ORAL
  Filled 2012-05-16 (×10): qty 1

## 2012-05-16 NOTE — Progress Notes (Signed)
Patient ID: William Cooper, male   DOB: 07/14/1960, 51 y.o.   MRN: [id number] D: Patient in dayroom on approach. Pt presented with depressed mood and flat affect. Pt stated able to attend groups and feeling a lot better. Pt interacting appropriately with peers. Cooperative with assessment. No acute distressed noted. Denies SI/HI/AV and pain. Pt encouraged to come to staff with any question or concerns  A: Medications administered as prescribed. Safety has been maintained with Q15 minutes observation. Supported and encouragement provided to attend groups.  R: Patient remains safe. He is complaint with medications and group programming. Safety has been maintained Q15 and continue current POC.

## 2012-05-16 NOTE — Progress Notes (Signed)
Patient did attend the evening karaoke group. Patient participated by singing two songs.

## 2012-05-16 NOTE — Progress Notes (Signed)
Psychoeducational Group Note  Date:  05/16/2012 Time:  2000  Group Topic/Focus:  AA group  Participation Level:  Active  Participation Quality:  Appropriate  Affect:  Appropriate  Cognitive:  Alert  Insight:  Good  Engagement in Group:  Good  Additional Comments:    Chelsea Nusz R 05/16/2012, 8:59 PM

## 2012-05-16 NOTE — Clinical Social Work Note (Signed)
,  Aftercare Planning Group: 05/16/2012 9:45 AM  Pt attended discharge planning group and actively participated in group.  CSW provided pt with today's workbook.  Pt presents with calm mood and affect.  Pt states that his depression and anxiety is high today.  Pt endorses HI but contracts for safety on the unit.  Pt has a date for [facility] on 12/4.  Pt states that he'd prefer to go straight to [facility].  CSW will assess for appropriate referrals.  No further needs voiced by pt at this time.    [facility] Group Note : Clinical Social Worker Group Therapy  05/16/2012  1:15 PM  Type of Therapy:  Group Therapy - Process Group  Participation Level:  Appropriate  Participation Quality:  Appropriate   Affect:  Appropriate  Cognitive:  Drowsy  Insight:  None  Engagement in Group:  None  Engagement in Therapy:  None  Modes of Intervention:  Clarification, Education, Problem-solving, Socialization and Support  Summary of Progress/Problems: Pt slept through the entire group on feelings towards relapse.     Sylvester Minton Horton, LCSWA 05/16/2012 3:00 pm

## 2012-05-16 NOTE — Clinical Social Work Note (Signed)
[facility] Adult Inpatient Family/Significant Other Suicide Prevention Education  Suicide Prevention Education:   Patient Refusal for Family/Significant Other Suicide Prevention Education: The patient has refused to provide written consent for family/significant other to be provided Family/Significant Other Suicide Prevention Education during admission and/or prior to discharge.  Physician notified.  CSW provided suicide prevention information with patient.    The suicide prevention education provided includes the following:  Suicide risk factors  Suicide prevention and interventions  National Suicide Hotline telephone number  [facility] assessment telephone number  [facility] Emergency Assistance 911  [facility] and/or Residential Mobile Crisis Unit telephone number   Reyes Ivan, [place] 05/16/2012 9:33 AM

## 2012-05-16 NOTE — Progress Notes (Signed)
[facility] Group Notes:  (Counselor/Nursing/MHT/Case Management/Adjunct)  05/16/2012 4:18 PM  Type of Therapy:  Psychoeducational Skills  Participation Level:  Active  Participation Quality:  Appropriate, Attentive, Sharing and Supportive  Affect:  Appropriate and Excited  Cognitive:  Appropriate  Insight:  Good  Engagement in Group:  Good  Engagement in Therapy:  Good  Modes of Intervention:  Activity, Problem-solving and Socialization  Summary of Progress/Problems: Pt participated in coping skills pictionary. Pt stated that he felt better when group was over from the time group began, as he laughed all during group and forgot about his problems.    Dalia Heading 05/16/2012, 4:18 PM

## 2012-05-16 NOTE — Progress Notes (Signed)
[facility] MD Progress Note  05/16/2012 4:22 PM William Cooper  MRN:  [id number]  Diagnosis:  Alcohol dependence, Mood Disorder NOS  ADL's:  Intact  Sleep: Poor  Appetite:  Fair  Suicidal Ideation:  Plan:  Denies Intent:  denies Means:  Denies Homicidal Ideation:  Plan:  Denies Intent:  Denies Means:  Denies  Irritated, agitated with fear of losing control. He admits he is having a hard time. He is working hard not to go off on groups  Mental Status Examination/Evaluation: Objective:  Appearance: Fairly Groomed  Patent attorney::  Minimal  Speech:  Clear and Coherent and Slow  Volume:  Decreased  Mood:  Angry, Anxious, Depressed and Irritable  Affect:  Restricted  Thought Process:  Coherent and Goal Directed  Orientation:  Full  Thought Content:  Rumination  Suicidal Thoughts:  Yes.  without intent/plan  Homicidal Thoughts:  No  Memory:  Immediate;   Fair Recent;   Fair Remote;   Fair  Judgement:  Fair  Insight:  Present  Psychomotor Activity:  Normal  Concentration:  Fair  Recall:  Fair  Akathisia:  No  Handed:  Right  AIMS (if indicated):     Assets:  Desire for Improvement  Sleep:  Number of Hours: 5.75    Vital Signs:Blood pressure 131/87, pulse 85, temperature 97.8 F (36.6 C), temperature source Oral, resp. rate 18, height 5\' 4"  (1.626 m), weight 79.833 kg (176 lb). Current Medications: Current Facility-Administered Medications  Medication Dose Route Frequency Provider Last Rate Last Dose  . acetaminophen (TYLENOL) tablet 650 mg  650 mg Oral Q6H PRN Kerry Hough, PA   650 mg at 05/14/12 1314  . [EXPIRED] chlordiazePOXIDE (LIBRIUM) capsule 25 mg  25 mg Oral TID Kerry Hough, PA   25 mg at 05/16/12 0827   Followed by  . chlordiazePOXIDE (LIBRIUM) capsule 25 mg  25 mg Oral BH-qamhs Kerry Hough, PA       Followed by  . [COMPLETED] chlordiazePOXIDE (LIBRIUM) capsule 25 mg  25 mg Oral Daily Kerry Hough, PA   25 mg at 05/15/12 1707  . gabapentin  (NEURONTIN) capsule 300 mg  300 mg Oral TID Rachael Fee, MD   300 mg at 05/16/12 1425  . hydrOXYzine (ATARAX/VISTARIL) tablet 25 mg  25 mg Oral Q6H PRN Kerry Hough, PA   25 mg at 05/16/12 1425  . loperamide (IMODIUM) capsule 2-4 mg  2-4 mg Oral PRN Kerry Hough, PA      . magnesium hydroxide (MILK OF MAGNESIA) suspension 30 mL  30 mL Oral Daily PRN Kerry Hough, PA      . mirtazapine (REMERON) tablet 30 mg  30 mg Oral QHS Rachael Fee, MD      . multivitamin with minerals tablet 1 tablet  1 tablet Oral Daily Kerry Hough, PA   1 tablet at 05/16/12 0827  . nicotine (NICODERM CQ - dosed in mg/24 hours) patch 21 mg  21 mg Transdermal Q0600 Kerry Hough, PA   21 mg at 05/16/12 0641  . ondansetron (ZOFRAN-ODT) disintegrating tablet 4 mg  4 mg Oral Q6H PRN Kerry Hough, PA   4 mg at 05/15/12 1710  . thiamine (VITAMIN B-1) tablet 100 mg  100 mg Oral Daily Kerry Hough, PA   100 mg at 05/16/12 0827  . [DISCONTINUED] traZODone (DESYREL) tablet 50 mg  50 mg Oral QHS,MR X 1 Kerry Hough, PA   50 mg at 05/15/12  2157    Lab Results: No results found for this or any previous visit (from the past 48 hour(s)).  Physical Findings: AIMS: Facial and Oral Movements Muscles of Facial Expression: None, normal Lips and Perioral Area: None, normal Jaw: None, normal Tongue: None, normal,Extremity Movements Upper (arms, wrists, hands, fingers): None, normal Lower (legs, knees, ankles, toes): None, normal, Trunk Movements Neck, shoulders, hips: None, normal, Overall Severity Severity of abnormal movements (highest score from questions above): None, normal Incapacitation due to abnormal movements: None, normal Patient's awareness of abnormal movements (rate only patient's report): No Awareness, Dental Status Current problems with teeth and/or dentures?: No Does patient usually wear dentures?: No  CIWA:  CIWA-Ar Total: 0  COWS:     Treatment Plan Summary: Daily contact with patient to  assess and evaluate symptoms and progress in treatment Medication management Reports side effects to most psychotropics  Plan: Supportive approach/coping skills/relaspe prevention/anger management           Continue Detox           Neurontin 300 mg TID  Severina Sykora A 05/16/2012, 4:22 PM

## 2012-05-16 NOTE — Tx Team (Signed)
Interdisciplinary Treatment Plan Update (Adult)  Date:  05/16/2012  Time Reviewed:  9:35 AM   Progress in Treatment: Attending groups: Yes Participating in groups:  Yes Taking medication as prescribed: Yes Tolerating medication:  Yes Family/Significant othe contact made: No, pt refused Patient understands diagnosis:  Yes Discussing patient identified problems/goals with staff:  Yes Medical problems stabilized or resolved:  Yes Denies suicidal/homicidal ideation: Yes Issues/concerns per patient self-inventory:  None identified Other: N/A  New problem(s) identified: None Identified  Reason for Continuation of Hospitalization: Anxiety Depression Medication stabilization Withdrawal symptoms  Interventions implemented related to continuation of hospitalization: mood stabilization, medication monitoring and adjustment, group therapy and psycho education, suicide risk assessment, collateral contact, aftercare planning, ongoing physician assessments and safety checks q 15 mins  Additional comments: N/A  Estimated length of stay: 3-5 days  Discharge Plan: CSW is assessing for appropriate referrals.  Pt wants to go to [facility] or [facility] when stable.    New goal(s): N/A  Review of initial/current patient goals per problem list:    1.  Goal(s): Address substance use by completing detox protocol  Met:  No  Target date: 4 days  As evidenced by: completes detox 11/23  2.  Goal (s): Reduce depressive symptoms from a 10 to a 3  Met:  No  Target date: 3-5 days  As evidenced by: Pt rates as high  3.  Goal (s): Reduce anxiety symptoms from a 10 to a 3  Met:  No  Target date:  3-5 days  As evidenced by: Pt rates as high  4.  Goal(s): Eliminate HI  Met:  No  Target date:   As evidenced by: pt continues to endorse HI   Attendees: Patient:     Family:     Physician: Geoffery Lyons, MD 05/16/2012 9:35 AM   Nursing: Alease Frame, RN 05/16/2012 9:35 AM     Clinical Social Worker:  Reyes Ivan, LCSWA 05/16/2012  9:35 AM   Other: Stephannie Li, RN 05/16/2012  9:35 AM   Other:     Other:     Other:     Other:      Scribe for Treatment Team:   Reyes Ivan 05/16/2012 9:35 AM

## 2012-05-16 NOTE — Progress Notes (Signed)
D-Patient has been out in the milieu interacting with peers and attending groups.  Quiet and guarded. Did not rate depression or hopelessness on patient inventory. "Off and on" SI and contracts for safety.  C/O agitation,diarrhea and chilling r/t withdrawal but these symptoms are not overtly observable.  No physical complaints verbalized to this writer and no prn medications requested. A- Support and encouragement offered.  Continue current POC and evaluation of treatment goals. Continue 15' checks for safety.  R- Remains safe. Appropriate mood/behavior/affect.

## 2012-05-16 NOTE — Progress Notes (Signed)
Patient ID: William Cooper, male   DOB: 07/10/1960, 51 y.o.   MRN: [id number] D: Pt presented with depressed mood and flat affect. Pt attended [employer] and interacted appropriately with peers. Calm and cooperative with assessment. No acute distressed noted. Passive  SI but contracted for safety. Pt denies HI/AV and pain. Pt encouraged to come to staff with any question or concerns  A: Medications administered as prescribed. Safety has been maintained with Q15 minutes observation. Supported and encouragement provided to attend groups.  R: Patient remains safe. He is complaint with medications and group programming. Safety has been maintained Q15 and continue current POC.

## 2012-05-17 DIAGNOSIS — F1020 Alcohol dependence, uncomplicated: Principal | ICD-10-CM

## 2012-05-17 DIAGNOSIS — F332 Major depressive disorder, recurrent severe without psychotic features: Secondary | ICD-10-CM

## 2012-05-17 MED ORDER — GABAPENTIN 100 MG PO CAPS
200.0000 mg | ORAL_CAPSULE | Freq: Three times a day (TID) | ORAL | Status: DC
  Administered 2012-05-17 – 2012-05-19 (×5): 200 mg via ORAL
  Filled 2012-05-17 (×9): qty 2
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
## 2012-05-17 NOTE — Progress Notes (Signed)
[facility] Group Notes:  (Counselor/Nursing/MHT/Case Management/Adjunct)  05/17/2012 2100  Type of Therapy:  wrap up group  Participation Level:  Minimal  Participation Quality:  Drowsy and Sharing  Affect:  Appropriate  Cognitive:  Appropriate  Insight:  Good  Engagement in Group:  Good  Engagement in Therapy:  Good  Modes of Intervention:  Clarification, Education and Support  Summary of Progress/Problems:   William Cooper 05/17/2012, 11:11 PM

## 2012-05-17 NOTE — Clinical Social Work Note (Signed)
[facility] Group Notes:  (Clinical Social Work)  05/17/2012  10:00-11:00AM  Summary of Progress/Problems:   The main focus of today's process group was for the patient to identify ways in which they have in the past sabotaged their own recovery and reasons they may have done this/what they received from doing it.  We then worked to identify a specific plan to avoid doing this when discharged from the hospital for this admission.  The patient expressed repeatedly that his problem is "women, they want me to do things."  He was very difficult to redirect because he seemed to enjoy the description of how many females are in his life.  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  When CSW tried to get him to identify what self-sabotaging messages he gives himself, he became irritable and said, "I've already told you, it's women."  CSW challenged him to take responsibility by saying what it is that he tells himself to engage in behavior he doesn't like and he ultimately said "I can't live without women."  However, when this was pointed out to him by CSW and group and he was confronted with whether he desires change at this time, he became angry and started cursing, "When I want to f---, I'm going to f---, and you can't stop me."  He then abruptly left the room.  Type of Therapy:  Group Therapy - Process  Participation Level:  Active  Participation Quality:  Drowsy, Resistant and Sharing  Affect:  Angry  Cognitive:  Oriented  Insight:  Limited  Engagement in Group:  Good  Engagement in Therapy:  None  Modes of Intervention:  Clarification, Education, Limit-setting, Problem-solving, Socialization, Support and Processing   Ambrose Mantle, LCSW 05/17/2012, 12:23 PM

## 2012-05-17 NOTE — Progress Notes (Signed)
Psychoeducational Group Note  Date: 05/17/2012  Time: 1315  Group Topic/Focus:  Personal Development  Participation Level: Did not attend  

## 2012-05-17 NOTE — Progress Notes (Signed)
D- Patient out in milieu interacting with peers. No group attendance. Flat affect and irritable mood but not inappropriate.  Rates depression at 3 and hopelessness at 0.  Denies SI and contracts for safety. Compliant with medication and no prn's requested.  A- Support and encouragement. Continue POC and evaluation of treatment goals. Continue 15' checks for safety. R- Remains safe.

## 2012-05-18 MED ORDER — NICOTINE 14 MG/24HR TD PT24
14.0000 mg | MEDICATED_PATCH | Freq: Every day | TRANSDERMAL | Status: DC
  Administered 2012-05-19 – 2012-05-20 (×2): 14 mg via TRANSDERMAL
  Filled 2012-05-18 (×3): qty 1

## 2012-05-18 MED ORDER — CHLORDIAZEPOXIDE HCL 25 MG PO CAPS
ORAL_CAPSULE | ORAL | Status: AC
  Administered 2012-05-18: 10:00:00
  Filled 2012-05-18: qty 1

## 2012-05-18 MED ORDER — CLONIDINE HCL 0.1 MG PO TABS
0.1000 mg | ORAL_TABLET | Freq: Every day | ORAL | Status: DC
  Administered 2012-05-18 – 2012-05-19 (×2): 0.1 mg via ORAL
  Filled 2012-05-18: qty 1
  Filled 2012-05-18: qty 14
  Filled 2012-05-18: qty 1
  Filled 2012-05-18: qty 14
  Filled 2012-05-18: qty 1
  Filled 2012-05-18: qty 14

## 2012-05-18 MED ORDER — MIRTAZAPINE 15 MG PO TABS
15.0000 mg | ORAL_TABLET | Freq: Every day | ORAL | Status: DC
  Administered 2012-05-18: 15 mg via ORAL
  Filled 2012-05-18 (×2): qty 1

## 2012-05-18 NOTE — Clinical Social Work Note (Signed)
[facility] Group Notes: (Clinical Social Work)   05/18/2012   10-11am   Type of Therapy:  Group Therapy   Participation Level:  Did Not Attend    Ambrose Mantle, LCSW 05/18/2012, 12:44 PM

## 2012-05-18 NOTE — Progress Notes (Signed)
Psychoeducational Group Note  Date: 05/18/2012  Time: 1315  Group Topic/Focus:  Support Systems Participation Level: Active  Participation Quality: Minimal Affect: Appropriate  Cognitive: Appropriate  Engagement in Group:Appropriate

## 2012-05-18 NOTE — Progress Notes (Signed)
Patient ID: William Cooper, male   DOB: [birth year], 51 y.o.   MRN: [id number] D: Pt. Slept late this AM and did not take meds until about this time, but denies any withdrawal symptoms at this time.    A: Pt. Looks very sleepy this AM, but is otherwise negative for untoward symptoms.  R: Will continue to observe for changes.  14:00--Pt. Attended groups today and participated appropriately.  Pt. C/o headache at noon and was treated successfully with tylenol for his symptoms (pain 7/10 down to 2/10).

## 2012-05-18 NOTE — Progress Notes (Signed)
Psychoeducational Group Note  Date:  05/18/2012 Time:  1130  Group Topic/Focus:  Spirituality:   The focus of this group is to discuss how one's spirituality can aide in recovery.  Participation Level:  Minimal  Participation Quality:  Drowsy  Affect:  Appropriate  Cognitive:  Appropriate  Insight:  Limited  Engagement in Group:  Limited  Additional Comments:    Meredith Staggers 05/18/2012, 2:17 PM
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
## 2012-05-18 NOTE — Progress Notes (Signed)
D: Pt continues to display angry/depressed affect and mood.  Interaction with peers and staff is minimal although Pt did attend evening shift groups and MHT note reports appropriate participation.  Pt complained to this RN that he feels "anxious, agitated, irritated" by "everyone around me telling me what to do".  This is reflected in intense eye contact, angry facial expression, and sarcastic tone of voice.  Pt fidgety and irritable when waiting for meds, walking away at one point when it was his turn and returning only upon request.  Pt's speech is logical and coherent, intermittently pressured/angry.  However, Pt does not display any evidence of disordered thought process or content.  Denies SI, AVH, and acute pain.  Acknowledged that he does have homicidal thought but that they are not directed toward any person present at or connected with [facility] (otherwise will not specify target).  Pt is contracting for safety on unit.  No PRN's requested this shift.  Pt is scheduled for discharge to bed at [facility] on 05/28/2012; has also requested [facility] bed.  A: Pt avoidant of staff during shift and so was difficult to engage 1:1.  Most discussion took place at med window.  Support offered for any time Pt would like to talk.  Encouraged Pt to spend time on milieu with peers.  All medications administered according to med orders and POC.  Q15 minute safety checks maintained as per unit policy.  R: Pt remains avoidant.  Extremely angry with himself for relapse.  Safety maintained. Dion Saucier RN

## 2012-05-18 NOTE — Progress Notes (Signed)
Psychoeducational Group Note  Date:  05/18/2012 Time:  1515  Group Topic/Focus:  Making Healthy Choices:   The focus of this group is to help patients identify negative/unhealthy choices they were using prior to admission and identify positive/healthier coping strategies to replace them upon discharge.  Participation Level:  Active  Participation Quality:  Attentive  Affect:  Appropriate  Cognitive:  Appropriate  Insight:  Good  Engagement in Group:  Good  Additional CommentsCasilda Carls 05/18/2012, 6:39 PM

## 2012-05-19 MED ORDER — CLONIDINE HCL 0.1 MG PO TABS
0.1000 mg | ORAL_TABLET | Freq: Once | ORAL | Status: AC
  Administered 2012-05-19: 0.1 mg via ORAL
  Filled 2012-05-19: qty 1

## 2012-05-19 MED ORDER — MIRTAZAPINE 30 MG PO TABS
30.0000 mg | ORAL_TABLET | Freq: Every day | ORAL | Status: DC
  Administered 2012-05-19: 30 mg via ORAL
  Filled 2012-05-19 (×2): qty 1

## 2012-05-19 MED ORDER — GABAPENTIN 300 MG PO CAPS
300.0000 mg | ORAL_CAPSULE | Freq: Three times a day (TID) | ORAL | Status: DC
  Administered 2012-05-19 – 2012-05-20 (×4): 300 mg via ORAL
  Filled 2012-05-19 (×6): qty 1

## 2012-05-19 NOTE — Tx Team (Addendum)
Interdisciplinary Treatment Plan Update (Adult)  Date:  05/19/2012  Time Reviewed:  5:25 PM   Progress in Treatment: Attending groups: Yes Participating in groups:  Yes Taking medication as prescribed: Yes Tolerating medication:  Yes Family/Significant othe contact made:  no Patient understands diagnosis:  Yes Discussing patient identified problems/goals with staff:  Yes Medical problems stabilized or resolved:  Yes Denies suicidal/homicidal ideation: Yes Issues/concerns per patient self-inventory:  None identified Other: N/A  New problem(s) identified: None Identified  Reason for Continuation of Hospitalization: Anxiety Depression Medication stabilization  Interventions implemented related to continuation of hospitalization: mood stabilization, medication monitoring and adjustment, group therapy and psycho education, suicide risk assessment, collateral contact, aftercare planning, ongoing physician assessments and safety checks q 15 mins  Additional comments: N/A  Estimated length of stay: 3-5 days  Discharge Plan: CSW is assessing for appropriate referrals.  Patient to be referred to [facility] or  [facility]  New goal(s): N/A  Review of initial/current patient goals per problem list:    1.  Goal(s): Address substance use by completing detox protocol  Met:  yes  Target date: 4 days  As evidenced by: completed substance abuse detox on 05/17/12  2.  Goal (s): Reduce depressive symptoms from a 10 to a 3  Met:  No  Target date: 3-5 days  As evidenced by: Pt rates at a 8 today  3.  Goal (s): Reduce anxiety symptoms from a 10 to a 3  Met:  No  Target date:  3-5 days  As evidenced by: Pt rates at a  8 today  4.  Goal(s):  Met:  No  Target date:   As evidenced by:   Attendees: Patient:     Family:     Physician: Geoffery Lyons, MD 05/19/2012 5:25 PM   Nursing: Roswell Miners, RN 05/19/2012 5:25 PM   Clinical Social Worker:   Reyes Ivan, LCSWA 05/19/2012  5:25 PM   Other: Clinical Social Worker: [employer], LCSW 05/19/2012  5:25 PM   Other:     Other:     Other:     Other:      Scribe for Treatment Team:   Reyes Ivan 05/19/2012 5:25 PM

## 2012-05-19 NOTE — Progress Notes (Signed)
Report given at 2330 indicated Pt had BP of 166/95 at 2230.  MD on call had been notified, ordered 1-time dose of Clonidine 0.1mg  PO now, administered at 2341.  Recheck by this RN at 0055 (manual) gave reading of 170/100 in left arm.  Med on call re-notified, ordered second 1-time dose of Clonidine 0.1 PO now, administered at 0115.  MD ordered recheck of BP in am; pt is now on VS BID. Dion Saucier RN

## 2012-05-19 NOTE — Progress Notes (Signed)
Patient ID: William Cooper, male   DOB: May 26, 1961, 51 y.o.   MRN: [id number] D: Pt. Reports depression at "4-5" of 10.  Pt. Worried about BP, notes that doctor suppose to start BP meds. Pt. Says not hypertensive prior, but has family hx. With  Mom being hypertensive. 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 Pt. Interested in [employer]. Pt. Regrets relapse and want to move on. A: Writer introduced self to client, provided emotional support, by encouraging pt. To move on from this time forward. Pt. Encourage to remember mom's wishes for him to live and succeed in life. Pt. Will be monitored q37min for safety. Pt. Encouraged to attend group. R: pt. Is safe on the unit. Pt. Attended group. Pt. Agrees mom would have wanted better for him. Pt. Plans to attend [facility].

## 2012-05-19 NOTE — Progress Notes (Signed)
Psychoeducational Group Note  Date:  05/19/2012 Time:  1100  Group Topic/Focus:  Self Care:   The focus of this group is to help patients understand the importance of self-care in order to improve or restore emotional, physical, spiritual, interpersonal, and financial health.  Participation Level:  Active  Participation Quality:  Appropriate, Attentive and Sharing  Affect:  Appropriate  Cognitive:  Alert, Appropriate and Oriented  Insight:  Good  Engagement in Group:  Good  Additional Comments:  Pt. Participated in group and discussed his barriers to self-care.   Ruta Hinds [facility] 05/19/2012, 3:20 PM

## 2012-05-19 NOTE — Progress Notes (Signed)
Patient did attend the evening speaker AA meeting. Pt slept through almost entire meeting.

## 2012-05-19 NOTE — Progress Notes (Signed)
[facility] MD Progress Note  05/19/2012 3:12 PM Orby Tangen  MRN:  [id number]  Diagnosis:  Alcohol Dependence, major Depression, mood Disorder NOS ADL's:  Intact  Sleep: Poor  Appetite:  Fair  Suicidal Ideation:  Plan:  Ideas on and off no plans Intent:  Denies Means:  Denies Homicidal Ideation:  Plan:  Ideas on and off, no plans Intent:  Denies Means:  Denies  Having a very hard time. Admits to irritability, anger. Cant listen to the people in group. He does not think they are serious, and this makes him want to hurt them, so he walks out to his room. He is isolating. He is more and more concerned about the way he is feeling as he is afraid that he could go off out there and hurt somebody  Mental Status Examination/Evaluation: Objective:  Appearance: Fairly Groomed  Eye Contact::  Minimal  Speech:  Clear and Coherent and Slow, not spontaneous, reserved, guarded  Volume:  Decreased  Mood:  Angry, Anxious and Irritable  Affect:  Restricted  Thought Process:  Coherent and Goal Directed  Orientation:  Full  Thought Content:  Ruminations  Suicidal Thoughts:  Yes.  without intent/plan  Homicidal Thoughts:  Yes.  without intent/plan  Memory:  Immediate;   Fair Recent;   Fair Remote;   Fair  Judgement:  Fair  Insight:  Fair  Psychomotor Activity:  Decreased  Concentration:  Fair  Recall:  Fair  Akathisia:  No  Handed:  Right  AIMS (if indicated):     Assets:  Desire for Improvement  Sleep:  Number of Hours: 6.5    Vital Signs:Blood pressure 158/113, pulse 74, temperature 97.7 F (36.5 C), temperature source Oral, resp. rate 20, height 5\' 4"  (1.626 m), weight 79.833 kg (176 lb). Current Medications: Current Facility-Administered Medications  Medication Dose Route Frequency Provider Last Rate Last Dose  . acetaminophen (TYLENOL) tablet 650 mg  650 mg Oral Q6H PRN Kerry Hough, PA   650 mg at 05/18/12 1208  . cloNIDine (CATAPRES) tablet 0.1 mg  0.1 mg Oral QHS Wonda Cerise,  MD   0.1 mg at 05/18/12 2341  . [COMPLETED] cloNIDine (CATAPRES) tablet 0.1 mg  0.1 mg Oral Once Wonda Cerise, MD   0.1 mg at 05/19/12 0115  . gabapentin (NEURONTIN) capsule 300 mg  300 mg Oral TID Rachael Fee, MD   300 mg at 05/19/12 1205  . magnesium hydroxide (MILK OF MAGNESIA) suspension 30 mL  30 mL Oral Daily PRN Kerry Hough, PA      . mirtazapine (REMERON) tablet 30 mg  30 mg Oral QHS Rachael Fee, MD      . multivitamin with minerals tablet 1 tablet  1 tablet Oral Daily Kerry Hough, PA   1 tablet at 05/19/12 0936  . nicotine (NICODERM CQ - dosed in mg/24 hours) patch 14 mg  14 mg Transdermal Q0600 Jorje Guild, PA-C   14 mg at 05/19/12 0647  . thiamine (VITAMIN B-1) tablet 100 mg  100 mg Oral Daily Kerry Hough, PA   100 mg at 05/19/12 0936  . [DISCONTINUED] gabapentin (NEURONTIN) capsule 200 mg  200 mg Oral TID Jorje Guild, PA-C   200 mg at 05/19/12 0936  . [DISCONTINUED] mirtazapine (REMERON) tablet 15 mg  15 mg Oral QHS Jorje Guild, PA-C   15 mg at 05/18/12 2126    Lab Results: No results found for this or any previous visit (from the past 48 hour(s)).  Physical  Findings: AIMS: Facial and Oral Movements Muscles of Facial Expression: None, normal Lips and Perioral Area: None, normal Jaw: None, normal Tongue: None, normal,Extremity Movements Upper (arms, wrists, hands, fingers): None, normal Lower (legs, knees, ankles, toes): None, normal, Trunk Movements Neck, shoulders, hips: None, normal, Overall Severity Severity of abnormal movements (highest score from questions above): None, normal Incapacitation due to abnormal movements: None, normal Patient's awareness of abnormal movements (rate only patient's report): No Awareness, Dental Status Current problems with teeth and/or dentures?: No Does patient usually wear dentures?: No  CIWA:  CIWA-Ar Total: 0  COWS:     Treatment Plan Summary: Daily contact with patient to assess and evaluate symptoms and progress in  treatment Medication management  Plan: Supportive approach/coping skills/relapse prevention/anger management           Increase the Neurontin  Clora Ohmer A 05/19/2012, 3:12 PM

## 2012-05-19 NOTE — Progress Notes (Signed)
Aftercare Planning Group 05/19/12 9:45am Patient attended group today but participated minimally.  Patient appeared agitated, stating that he did not have a good weekend.  Patient rated his depression and anxiety today at a 8.  Patient would not go in to details regarding his struggles over the weekend.  Patient is set up for [facility] on 12/4 but would like to try to get in to [facility].  CSW will continue to assess for appropriate referrals.  No further needs voiced by patient at this time.     [facility] Group Notes:  (Counselor/Nursing/MHT/Case Management/Adjunct)  05/19/2012 6:37 PM  Type of Therapy:  Group Therapy  Participation Level:  None  Participation Quality:  Drowsy  Affect:  Angry and Irritable  Cognitive:  n/a  Insight:  None  Engagement in Group:  None  Engagement in Therapy:  None  Modes of Intervention:  Support  Summary of Progress/Problems: Patient attended group, however slept through entire group. Patient did not participate.  Verna Czech [place] 05/19/2012, 6:37 PM

## 2012-05-19 NOTE — Progress Notes (Signed)
D: Pt resting in bed, eyes closed, respirations even and unlabored.   A: Will continue to monitor.  R: In no apparent distress. Shanai Lartigue RN 

## 2012-05-19 NOTE — Progress Notes (Signed)
Patient ID: William Cooper, male   DOB: 04/19/1961, 51 y.o.   MRN: [id number] He has been up and to groups today. Interacting with peers and staff.  His B/P is running high and the NP is reviewing his medications. He has not c/o any discomfort today.

## 2012-05-20 MED ORDER — GABAPENTIN 300 MG PO CAPS
300.0000 mg | ORAL_CAPSULE | Freq: Every day | ORAL | Status: DC
  Filled 2012-05-20: qty 1

## 2012-05-20 MED ORDER — TRAZODONE HCL 100 MG PO TABS: 100.0000 mg | ORAL_TABLET | Freq: Every evening | ORAL | Status: DC | PRN

## 2012-05-20 MED ORDER — RISPERIDONE 0.5 MG PO TABS: 0.5000 mg | ORAL_TABLET | Freq: Every day | ORAL | Status: DC

## 2012-05-20 MED ORDER — RISPERIDONE 0.5 MG PO TABS
0.5000 mg | ORAL_TABLET | Freq: Every day | ORAL | Status: DC
  Filled 2012-05-20: qty 1

## 2012-05-20 MED ORDER — GABAPENTIN 300 MG PO CAPS: 300.0000 mg | ORAL_CAPSULE | Freq: Three times a day (TID) | ORAL | Status: DC

## 2012-05-20 MED ORDER — TRAZODONE HCL 100 MG PO TABS
100.0000 mg | ORAL_TABLET | Freq: Every evening | ORAL | Status: DC | PRN
  Filled 2012-05-20: qty 14

## 2012-05-20 MED ORDER — GABAPENTIN 300 MG PO CAPS: 300.0000 mg | ORAL_CAPSULE | Freq: Every day | ORAL | Status: DC

## 2012-05-20 MED ORDER — GABAPENTIN 300 MG PO CAPS
300.0000 mg | ORAL_CAPSULE | Freq: Four times a day (QID) | ORAL | Status: DC
  Filled 2012-05-20 (×3): qty 56

## 2012-05-20 MED ORDER — CLONIDINE HCL 0.1 MG PO TABS: 0.1000 mg | ORAL_TABLET | Freq: Every day | ORAL | Status: DC

## 2012-05-20 NOTE — Progress Notes (Signed)
[facility] Group Notes:  (Counselor/Nursing/MHT/Case Management/Adjunct)  05/20/2012 3:40 PM  Type of Therapy:  Psychoeducational Skills  Participation Level:  Active  Participation Quality:  Appropriate, Attentive, Intrusive, Redirectable and Sharing  Affect:  Appropriate and Excited  Cognitive:  Alert, Appropriate and Oriented  Insight:  Good  Engagement in Group:  Limited  Engagement in Therapy:  n/a  Modes of Intervention:  Activity, Education, Limit-setting, Problem-solving, Socialization and Support  Summary of Progress/Problems:  William Cooper attended psychoeducational group on labels. 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 William Cooper was distracted by peers at times but insightful when contributed while group discussed what labels are, how we use them, how they effect the way we think about and perceive the world, and listed positive and negative labels they have used or been called. William Cooper was given homework assignment to list 10 words he has been labeled and to find the reality of the situation/label.    Wandra Scot 05/20/2012, 3:40 PM

## 2012-05-20 NOTE — Discharge Summary (Signed)
Physician Discharge Summary Note  Patient:  William Cooper is an 51 y.o., male MRN:  [id number] DOB:  02-20-1961 Patient phone:  [phone] (home)  Patient address:   [address] [place] [address],   Date of Admission:  05/13/2012 Date of Discharge: 05/20/2012  Reason for Admission:  Alcohol dependency/detox, substance abuse, homicidal thoughts  Discharge Diagnoses: Active Problems:  Major depression  Alcohol dependence  Axis Diagnosis:  AXIS I:  Alcohol Abuse, Major Depression, single episode and Substance Abuse AXIS II:  Deferred AXIS III:   Past Medical History  Diagnosis Date  . Alcohol abuse   . Substance abuse    AXIS IV:  economic problems, housing problems, other psychosocial or environmental problems, problems related to social environment and problems with primary support group AXIS V:  61-70 mild symptoms  Level of Care:  [facility]  Hospital Course:   Patient attended individual and group therapy while inpatient along with attending AA groups, one-one time with MD daily, medications for detox managed during inpatient, follow-up appointments made prior to discharge   Consults:  None  Significant Diagnostic Studies:  labs: Completed and reviewed in ED, stable  Discharge Vitals:   Blood pressure 137/86, pulse 79, temperature 97.4 F (36.3 C), temperature source Oral, resp. rate 16, height 5\' 4"  (1.626 m), weight 79.833 kg (176 lb). Lab Results:   No results found for this or any previous visit (from the past 72 hour(s)).  Physical Findings: AIMS: Facial and Oral Movements Muscles of Facial Expression: None, normal Lips and Perioral Area: None, normal Jaw: None, normal Tongue: None, normal,Extremity Movements Upper (arms, wrists, hands, fingers): None, normal Lower (legs, knees, ankles, toes): None, normal, Trunk Movements Neck, shoulders, hips: None, normal, Overall Severity Severity of abnormal movements (highest score from questions above):  None, normal Incapacitation due to abnormal movements: None, normal Patient's awareness of abnormal movements (rate only patient's report): No Awareness, Dental Status Current problems with teeth and/or dentures?: No Does patient usually wear dentures?: No  CIWA:  CIWA-Ar Total: 0  COWS:     Mental Status Exam: See Mental Status Examination and Suicide Risk Assessment completed by Attending Physician prior to discharge.  Discharge destination:  [facility]  Is patient on multiple antipsychotic therapies at discharge:  No   Has Patient had three or more failed trials of antipsychotic monotherapy by history:  No Recommended Plan for Multiple Antipsychotic Therapies:  N/A   Discharge Orders    Future Orders Please Complete By Expires   Diet - low sodium heart healthy      Activity as tolerated - No restrictions          Medication List     As of 05/20/2012 11:07 AM    TAKE these medications      Indication    cloNIDine 0.1 MG tablet   Commonly known as: CATAPRES   Take 1 tablet (0.1 mg total) by mouth at bedtime.    Indication: High Blood Pressure      gabapentin 300 MG capsule   Commonly known as: NEURONTIN   Take 1 capsule (300 mg total) by mouth 3 (three) times daily.    Indication: Agitation, Trouble Sleeping      gabapentin 300 MG capsule   Commonly known as: NEURONTIN   Take 1 capsule (300 mg total) by mouth at bedtime.    Indication: Trouble Sleeping      risperiDONE 0.5 MG tablet   Commonly known as: RISPERDAL   Take 1 tablet (0.5  mg total) by mouth at bedtime.    Indication: Manic-Depression, Easily Angered or Annoyed      traZODone 100 MG tablet   Commonly known as: DESYREL   Take 1 tablet (100 mg total) by mouth at bedtime as needed for sleep.    Indication: Trouble Sleeping           Follow-up Information    Follow up with [facility]. On 05/20/2012. (Will be picked up at 2:00 pm)    Contact information:   [address] [place], [place] [address]  [phone]        Follow-up recommendations:  Activity as tolerated, low-sodium heart healthy diet   Comments:  Patient denied suicidal/homicidal ideations and auditory/visual hallucinations, follow-up appointments encouraged to attend, outside support groups encouraged and information given, patient will continue his rehab at [facility]  Signed: Nanine Means, PMH-NP 05/20/2012, 11:07 AM

## 2012-05-20 NOTE — Progress Notes (Signed)
[facility] Case Management Discharge Plan:  Will you be returning to the same living situation after discharge: No. Going for further treatment after d/c from here At discharge, do you have transportation home?:Yes,  [facility] will transport pt there Do you have the ability to pay for your medications:Yes,  provided samples   Release of information consent forms completed and in the chart;  Patient's signature needed at discharge.  Patient to Follow up at:  Follow-up Information    Follow up with [facility]. On 05/20/2012. (Will be picked up at 2:00 pm)    Contact information:   [address] [place], [place] [address] [phone]         Patient denies SI/HI:   Yes,  denies SI/HI today    Safety Planning and Suicide Prevention discussed:  Yes,  discussed with pt today  Barrier to discharge identified:No.  Summary and Recommendations: Pt attended discharge planning group and actively participated in group.  CSW provided pt with today's workbook.  Pt presents with flat affect and depressed mood.  Pt rates depression and anxiety at a 5 today.  Pt denies SI/HI.  Pt reports feeling stable to d/c today. No recommendations from CSW.  No further needs voiced by pt.  Pt stable to discharge.     Carmina Miller 05/20/2012, 11:00 AM

## 2012-05-20 NOTE — [facility] Suicide Risk Assessment (Signed)
Suicide Risk Assessment  Discharge Assessment     Demographic Factors:  Male, Living alone and Unemployed  Mental Status Per Nursing Assessment::   On Admission:  Thoughts of violence towards others  Current Mental Status by Physician: In full contact with reality. There are no suicidal ideas, plans or intent. There are no homicidal ideas plans or intent. Feels better today. A bed came open at [facility]. He is motivated to pursue further treatment.   Loss Factors: Financial problems/change in socioeconomic status  Historical Factors: NA  Risk Reduction Factors:   Wanting to get better  Continued Clinical Symptoms:  Depression:   Comorbid alcohol abuse/dependence Alcohol/Substance Abuse/Dependencies  Cognitive Features That Contribute To Risk:  Closed-mindedness    Suicide Risk:  Minimal: No identifiable suicidal ideation.  Patients presenting with no risk factors but with morbid ruminations; may be classified as minimal risk based on the severity of the depressive symptoms  Discharge Diagnoses:   AXIS I:  Alcohol Dependence, Major Depression, Mood Disorder NOS AXIS II:  Deferred AXIS III:   Past Medical History  Diagnosis Date  . Alcohol abuse   . Substance abuse    AXIS IV:  economic problems, occupational problems and problems with primary support group AXIS V:  61-70 mild symptoms  Plan Of Care/Follow-up recommendations:  Activity:  As tolerated Diet:  Regular Will be admitted to [facility] today  Is patient on multiple antipsychotic therapies at discharge:  No   Has Patient had three or more failed trials of antipsychotic monotherapy by history:  No  Recommended Plan for Multiple Antipsychotic Therapies: N/A   Vinh Sachs A 05/20/2012, 1:06 PM

## 2012-05-20 NOTE — Progress Notes (Signed)
Psychoeducational Group Note  Date:  05/20/2012 Time:  2000  Group Topic/Focus:  Wrap-Up Group:   The focus of this group is to help patients review their daily goal of treatment and discuss progress on daily workbooks.  Participation Level:  Minimal  Participation Quality:  Appropriate  Affect:  Appropriate  Cognitive:  Oriented  Insight:  Limited  Engagement in Group:  Limited  Additional Comments:    Humberto Seals Monique 05/20/2012, 12:51 AM

## 2012-05-20 NOTE — Progress Notes (Signed)
D:  Patient up and attending groups this morning.  Call from [facility] this morning stating they had a bed for him today and could pick him up at 14:00.  Denies depressive symptoms or suicidal ideation.   A:  Reviewed all discharge instructions, medications, and follow up care.  Patient was given a two week supply of medication and prescriptions for renewals.  Escorted patient to the search room to collect belongings, he was then escorted to the lobby to await transport to [facility].   R:  Verbalized understanding of all instructions.  States he feels ready to leave the acute care setting.  Pleasant and cooperative on the unit and during the discharge process.

## 2012-05-20 NOTE — Tx Team (Signed)
Interdisciplinary Treatment Plan Update (Adult)  Date:  05/20/2012  Time Reviewed:  11:02 AM   Progress in Treatment: Attending groups: Yes Participating in groups:  Yes Taking medication as prescribed: Yes Tolerating medication:  Yes Family/Significant othe contact made:  No, pt refused Patient understands diagnosis:  Yes Discussing patient identified problems/goals with staff:  Yes Medical problems stabilized or resolved:  Yes Denies suicidal/homicidal ideation: Yes Issues/concerns per patient self-inventory:  None identified Other: N/A  New problem(s) identified: None Identified  Reason for Continuation of Hospitalization: Stable to d/c  Interventions implemented related to continuation of hospitalization: Stable to d/c  Additional comments: N/A  Estimated length of stay: D/C today  Discharge Plan: Pt will follow up at [facility] today for further treatment.     New goal(s): N/A  Review of initial/current patient goals per problem list:    2.  Goal (s): Reduce depressive symptoms by reducing from a 10 to a 3  Met:  Yes  Target date: today  As evidenced by: Pt rates at a 5 today but reports feeling stable to d/c today.     3.  Goal (s): Reduce anxiety symptoms by reducing from a 10 to a 3  Met:  Yes  Target date: today  As evidenced by: Pt rates at a 5 today but reports feeling stable to d/c today.    4.  Goal(s): Eliminate HI  Met:  Yes  Target date: by discharge  As evidenced by: Pt denies SI/HI.    Attendees: Patient:   05/20/2012 11:02 AM   Family:     Physician:  Geoffery Lyons, MD 05/20/2012 11:02 AM   Nursing:  05/20/2012 11:02 AM   Clinical Social Worker:  Reyes Ivan, LCSWA 05/20/2012 11:02 AM   Other:  05/20/2012 11:02 AM   Other:     Other:     Other:     Other:      Scribe for Treatment Team:   Reyes Ivan 05/20/2012 11:02 AM

## 2012-05-26 NOTE — Progress Notes (Signed)
Patient Discharge Instructions:  After Visit Summary (AVS):   Faxed to:  05/26/12 Psychiatric Admission Assessment Note:   Faxed to:  05/26/12 Suicide Risk Assessment - Discharge Assessment:   Faxed to:  05/26/12 Faxed/Sent to the Next Level Care provider:  05/26/12 Faxed to [facility] @ [phone]  Jerelene Redden, 05/26/2012, 3:33 PM

## 2012-05-30 NOTE — Discharge Summary (Signed)
Agree with assessment and plan William Cooper, M.D. 

## 2013-07-17 DIAGNOSIS — S81809A Unspecified open wound, unspecified lower leg, initial encounter: Secondary | ICD-10-CM | POA: Diagnosis not present

## 2013-07-17 DIAGNOSIS — S81009A Unspecified open wound, unspecified knee, initial encounter: Secondary | ICD-10-CM | POA: Diagnosis not present

## 2013-07-17 DIAGNOSIS — F4323 Adjustment disorder with mixed anxiety and depressed mood: Secondary | ICD-10-CM | POA: Diagnosis not present

## 2013-07-17 DIAGNOSIS — L080 Pyoderma: Secondary | ICD-10-CM | POA: Diagnosis not present

## 2013-07-17 DIAGNOSIS — T07XXXA Unspecified multiple injuries, initial encounter: Secondary | ICD-10-CM | POA: Diagnosis not present

## 2013-08-03 DIAGNOSIS — F17200 Nicotine dependence, unspecified, uncomplicated: Secondary | ICD-10-CM | POA: Diagnosis not present

## 2013-08-03 DIAGNOSIS — L98499 Non-pressure chronic ulcer of skin of other sites with unspecified severity: Secondary | ICD-10-CM | POA: Diagnosis not present

## 2013-08-03 DIAGNOSIS — M79609 Pain in unspecified limb: Secondary | ICD-10-CM | POA: Diagnosis not present

## 2013-08-03 DIAGNOSIS — L97909 Non-pressure chronic ulcer of unspecified part of unspecified lower leg with unspecified severity: Secondary | ICD-10-CM | POA: Diagnosis not present

## 2013-08-03 DIAGNOSIS — L905 Scar conditions and fibrosis of skin: Secondary | ICD-10-CM | POA: Diagnosis not present

## 2013-08-12 DIAGNOSIS — L97909 Non-pressure chronic ulcer of unspecified part of unspecified lower leg with unspecified severity: Secondary | ICD-10-CM | POA: Diagnosis not present

## 2013-08-12 DIAGNOSIS — F17200 Nicotine dependence, unspecified, uncomplicated: Secondary | ICD-10-CM | POA: Diagnosis not present

## 2013-08-17 DIAGNOSIS — L97909 Non-pressure chronic ulcer of unspecified part of unspecified lower leg with unspecified severity: Secondary | ICD-10-CM | POA: Diagnosis not present

## 2013-08-17 DIAGNOSIS — I739 Peripheral vascular disease, unspecified: Secondary | ICD-10-CM | POA: Diagnosis not present

## 2013-08-31 DIAGNOSIS — L97809 Non-pressure chronic ulcer of other part of unspecified lower leg with unspecified severity: Secondary | ICD-10-CM | POA: Diagnosis not present

## 2013-09-03 DIAGNOSIS — L97809 Non-pressure chronic ulcer of other part of unspecified lower leg with unspecified severity: Secondary | ICD-10-CM | POA: Diagnosis not present

## 2013-09-03 DIAGNOSIS — L97909 Non-pressure chronic ulcer of unspecified part of unspecified lower leg with unspecified severity: Secondary | ICD-10-CM | POA: Diagnosis not present

## 2013-09-03 DIAGNOSIS — L97209 Non-pressure chronic ulcer of unspecified calf with unspecified severity: Secondary | ICD-10-CM | POA: Diagnosis not present

## 2013-12-26 DIAGNOSIS — S7000XA Contusion of unspecified hip, initial encounter: Secondary | ICD-10-CM | POA: Diagnosis not present

## 2013-12-26 DIAGNOSIS — S335XXA Sprain of ligaments of lumbar spine, initial encounter: Secondary | ICD-10-CM | POA: Diagnosis not present

## 2014-06-06 DIAGNOSIS — R51 Headache: Secondary | ICD-10-CM | POA: Diagnosis not present

## 2014-06-06 DIAGNOSIS — S0990XA Unspecified injury of head, initial encounter: Secondary | ICD-10-CM | POA: Diagnosis not present

## 2014-06-06 DIAGNOSIS — S0511XA Contusion of eyeball and orbital tissues, right eye, initial encounter: Secondary | ICD-10-CM | POA: Diagnosis not present

## 2014-06-06 DIAGNOSIS — S299XXA Unspecified injury of thorax, initial encounter: Secondary | ICD-10-CM | POA: Diagnosis not present

## 2014-06-06 DIAGNOSIS — S199XXA Unspecified injury of neck, initial encounter: Secondary | ICD-10-CM | POA: Diagnosis not present

## 2014-06-06 DIAGNOSIS — R079 Chest pain, unspecified: Secondary | ICD-10-CM | POA: Diagnosis not present

## 2014-06-06 DIAGNOSIS — J449 Chronic obstructive pulmonary disease, unspecified: Secondary | ICD-10-CM | POA: Diagnosis not present

## 2014-06-06 DIAGNOSIS — S098XXA Other specified injuries of head, initial encounter: Secondary | ICD-10-CM | POA: Diagnosis not present

## 2014-06-06 DIAGNOSIS — S0101XA Laceration without foreign body of scalp, initial encounter: Secondary | ICD-10-CM | POA: Diagnosis not present

## 2014-06-22 DIAGNOSIS — F332 Major depressive disorder, recurrent severe without psychotic features: Secondary | ICD-10-CM | POA: Diagnosis not present

## 2014-06-23 DIAGNOSIS — F332 Major depressive disorder, recurrent severe without psychotic features: Secondary | ICD-10-CM | POA: Diagnosis not present

## 2014-12-31 DIAGNOSIS — M169 Osteoarthritis of hip, unspecified: Secondary | ICD-10-CM | POA: Diagnosis not present

## 2014-12-31 DIAGNOSIS — M47819 Spondylosis without myelopathy or radiculopathy, site unspecified: Secondary | ICD-10-CM | POA: Diagnosis not present

## 2014-12-31 DIAGNOSIS — Z96641 Presence of right artificial hip joint: Secondary | ICD-10-CM | POA: Diagnosis not present

## 2015-03-03 DIAGNOSIS — M25551 Pain in right hip: Secondary | ICD-10-CM | POA: Diagnosis not present

## 2015-03-03 DIAGNOSIS — M25559 Pain in unspecified hip: Secondary | ICD-10-CM | POA: Diagnosis not present

## 2015-03-04 ENCOUNTER — Other Ambulatory Visit (HOSPITAL_COMMUNITY): Payer: Self-pay | Admitting: Orthopedic Surgery

## 2015-03-04 DIAGNOSIS — M25551 Pain in right hip: Secondary | ICD-10-CM

## 2015-03-09 DIAGNOSIS — M25551 Pain in right hip: Secondary | ICD-10-CM | POA: Diagnosis not present

## 2015-03-09 DIAGNOSIS — M25559 Pain in unspecified hip: Secondary | ICD-10-CM | POA: Diagnosis not present

## 2015-03-15 ENCOUNTER — Ambulatory Visit (HOSPITAL_COMMUNITY): Payer: Medicare Other

## 2015-03-15 ENCOUNTER — Encounter (HOSPITAL_COMMUNITY): Payer: Self-pay

## 2015-03-17 DIAGNOSIS — Z96641 Presence of right artificial hip joint: Secondary | ICD-10-CM | POA: Diagnosis not present

## 2015-03-17 DIAGNOSIS — Z471 Aftercare following joint replacement surgery: Secondary | ICD-10-CM | POA: Diagnosis not present

## 2015-03-17 DIAGNOSIS — M25551 Pain in right hip: Secondary | ICD-10-CM | POA: Diagnosis not present

## 2015-04-21 DIAGNOSIS — R51 Headache: Secondary | ICD-10-CM | POA: Diagnosis not present

## 2015-04-21 DIAGNOSIS — H43393 Other vitreous opacities, bilateral: Secondary | ICD-10-CM | POA: Diagnosis not present

## 2015-04-21 DIAGNOSIS — H2513 Age-related nuclear cataract, bilateral: Secondary | ICD-10-CM | POA: Diagnosis not present

## 2015-04-21 DIAGNOSIS — H52203 Unspecified astigmatism, bilateral: Secondary | ICD-10-CM | POA: Diagnosis not present

## 2015-04-29 DIAGNOSIS — M25551 Pain in right hip: Secondary | ICD-10-CM | POA: Diagnosis not present

## 2015-04-29 DIAGNOSIS — Z471 Aftercare following joint replacement surgery: Secondary | ICD-10-CM | POA: Diagnosis not present

## 2015-04-29 DIAGNOSIS — Z96641 Presence of right artificial hip joint: Secondary | ICD-10-CM | POA: Diagnosis not present

## 2015-08-15 DIAGNOSIS — F17210 Nicotine dependence, cigarettes, uncomplicated: Secondary | ICD-10-CM | POA: Diagnosis not present

## 2015-08-15 DIAGNOSIS — F332 Major depressive disorder, recurrent severe without psychotic features: Secondary | ICD-10-CM | POA: Diagnosis not present

## 2015-08-15 DIAGNOSIS — F419 Anxiety disorder, unspecified: Secondary | ICD-10-CM | POA: Diagnosis not present

## 2015-10-03 DIAGNOSIS — F4310 Post-traumatic stress disorder, unspecified: Secondary | ICD-10-CM | POA: Diagnosis not present

## 2015-10-20 DIAGNOSIS — F4310 Post-traumatic stress disorder, unspecified: Secondary | ICD-10-CM | POA: Diagnosis not present

## 2016-05-22 DIAGNOSIS — F419 Anxiety disorder, unspecified: Secondary | ICD-10-CM | POA: Diagnosis not present

## 2016-05-31 DIAGNOSIS — F419 Anxiety disorder, unspecified: Secondary | ICD-10-CM | POA: Diagnosis not present

## 2016-07-20 DIAGNOSIS — F419 Anxiety disorder, unspecified: Secondary | ICD-10-CM | POA: Diagnosis not present

## 2016-07-30 DIAGNOSIS — R5383 Other fatigue: Secondary | ICD-10-CM | POA: Diagnosis not present

## 2016-07-30 DIAGNOSIS — Z0000 Encounter for general adult medical examination without abnormal findings: Secondary | ICD-10-CM | POA: Diagnosis not present

## 2016-07-30 DIAGNOSIS — E291 Testicular hypofunction: Secondary | ICD-10-CM | POA: Diagnosis not present

## 2016-07-30 DIAGNOSIS — Z113 Encounter for screening for infections with a predominantly sexual mode of transmission: Secondary | ICD-10-CM | POA: Diagnosis not present

## 2016-07-30 DIAGNOSIS — Z1389 Encounter for screening for other disorder: Secondary | ICD-10-CM | POA: Diagnosis not present

## 2016-07-30 DIAGNOSIS — E1165 Type 2 diabetes mellitus with hyperglycemia: Secondary | ICD-10-CM | POA: Diagnosis not present

## 2016-07-30 DIAGNOSIS — R0602 Shortness of breath: Secondary | ICD-10-CM | POA: Diagnosis not present

## 2016-07-30 DIAGNOSIS — Z125 Encounter for screening for malignant neoplasm of prostate: Secondary | ICD-10-CM | POA: Diagnosis not present

## 2016-07-30 DIAGNOSIS — E559 Vitamin D deficiency, unspecified: Secondary | ICD-10-CM | POA: Diagnosis not present

## 2016-07-30 DIAGNOSIS — E782 Mixed hyperlipidemia: Secondary | ICD-10-CM | POA: Diagnosis not present

## 2016-08-07 DIAGNOSIS — F419 Anxiety disorder, unspecified: Secondary | ICD-10-CM | POA: Diagnosis not present

## 2016-08-08 ENCOUNTER — Encounter (HOSPITAL_BASED_OUTPATIENT_CLINIC_OR_DEPARTMENT_OTHER): Payer: Self-pay | Admitting: Emergency Medicine

## 2016-08-08 ENCOUNTER — Emergency Department (HOSPITAL_BASED_OUTPATIENT_CLINIC_OR_DEPARTMENT_OTHER): Payer: Medicare Other

## 2016-08-08 ENCOUNTER — Emergency Department (HOSPITAL_BASED_OUTPATIENT_CLINIC_OR_DEPARTMENT_OTHER)
Admission: EM | Admit: 2016-08-08 | Discharge: 2016-08-08 | Disposition: A | Discharge status: Home / Self Care | Payer: Medicare Other | Attending: Emergency Medicine | Admitting: Emergency Medicine

## 2016-08-08 DIAGNOSIS — Z0389 Encounter for observation for other suspected diseases and conditions ruled out: Secondary | ICD-10-CM | POA: Diagnosis not present

## 2016-08-08 DIAGNOSIS — Z87891 Personal history of nicotine dependence: Secondary | ICD-10-CM | POA: Insufficient documentation

## 2016-08-08 DIAGNOSIS — K529 Noninfective gastroenteritis and colitis, unspecified: Secondary | ICD-10-CM | POA: Diagnosis not present

## 2016-08-08 DIAGNOSIS — K429 Umbilical hernia without obstruction or gangrene: Secondary | ICD-10-CM | POA: Diagnosis not present

## 2016-08-08 DIAGNOSIS — R1084 Generalized abdominal pain: Secondary | ICD-10-CM | POA: Diagnosis present

## 2016-08-08 DIAGNOSIS — R079 Chest pain, unspecified: Secondary | ICD-10-CM | POA: Diagnosis not present

## 2016-08-08 LAB — COMPREHENSIVE METABOLIC PANEL
ALBUMIN: 4.5 g/dL (ref 3.5–5.0)
ALK PHOS: 64 U/L (ref 38–126)
ALT: 31 U/L (ref 17–63)
ANION GAP: 8 (ref 5–15)
AST: 35 U/L (ref 15–41)
BUN: 15 mg/dL (ref 6–20)
CALCIUM: 9 mg/dL (ref 8.9–10.3)
CHLORIDE: 102 mmol/L (ref 101–111)
CO2: 23 mmol/L (ref 22–32)
Creatinine, Ser: 1.17 mg/dL (ref 0.61–1.24)
GFR calc non Af Amer: >60 mL/min (ref >60)
GLUCOSE: 144 mg/dL — AB (ref 65–99)
POTASSIUM: 3.9 mmol/L (ref 3.5–5.1)
SODIUM: 133 mmol/L — AB (ref 135–145)
Total Bilirubin: 1.1 mg/dL (ref 0.3–1.2)
Total Protein: 8.4 g/dL — ABNORMAL HIGH (ref 6.5–8.1)

## 2016-08-08 LAB — CBC
HEMATOCRIT: 41.2 % (ref 39.0–52.0)
HEMOGLOBIN: 14.5 g/dL (ref 13.0–17.0)
MCH: 28.8 pg (ref 26.0–34.0)
MCHC: 35.2 g/dL (ref 30.0–36.0)
MCV: 81.9 fL (ref 78.0–100.0)
Platelets: 242 10*3/uL (ref 150–400)
RBC: 5.03 MIL/uL (ref 4.22–5.81)
RDW: 14 % (ref 11.5–15.5)
WBC: 8.8 10*3/uL (ref 4.0–10.5)

## 2016-08-08 LAB — URINALYSIS, ROUTINE W REFLEX MICROSCOPIC
Bilirubin Urine: NEGATIVE
Glucose, UA: NEGATIVE mg/dL
HGB URINE DIPSTICK: NEGATIVE
Ketones, ur: NEGATIVE mg/dL
Leukocytes, UA: NEGATIVE
Nitrite: NEGATIVE
PH: 5.5 (ref 5.0–8.0)
Protein, ur: NEGATIVE mg/dL
Specific Gravity, Urine: 1.019 (ref 1.005–1.030)

## 2016-08-08 LAB — LIPASE, BLOOD: LIPASE: 14 U/L (ref 11–51)

## 2016-08-08 MED ORDER — HYDROMORPHONE HCL 1 MG/ML IJ SOLN
1.0000 mg | Freq: Once | INTRAMUSCULAR | Status: AC
  Administered 2016-08-08: 1 mg via INTRAVENOUS
  Filled 2016-08-08: qty 1

## 2016-08-08 MED ORDER — PROMETHAZINE HCL 25 MG PO TABS: 25.0000 mg | ORAL_TABLET | Freq: Three times a day (TID) | ORAL | 0 refills | Status: DC | PRN

## 2016-08-08 MED ORDER — FENTANYL CITRATE (PF) 100 MCG/2ML IJ SOLN
100.0000 ug | Freq: Once | INTRAMUSCULAR | Status: AC
  Administered 2016-08-08: 100 ug via INTRAVENOUS
  Filled 2016-08-08: qty 2

## 2016-08-08 MED ORDER — ONDANSETRON HCL 4 MG/2ML IJ SOLN: 4.0000 mg | Freq: Once | INTRAMUSCULAR | Status: DC

## 2016-08-08 MED ORDER — SODIUM CHLORIDE 0.9 % IV BOLUS (SEPSIS)
1000.0000 mL | Freq: Once | INTRAVENOUS | Status: AC
  Administered 2016-08-08: 1000 mL via INTRAVENOUS

## 2016-08-08 MED ORDER — KETOROLAC TROMETHAMINE 30 MG/ML IJ SOLN
30.0000 mg | Freq: Once | INTRAMUSCULAR | Status: AC
  Administered 2016-08-08: 30 mg via INTRAVENOUS
  Filled 2016-08-08: qty 1

## 2016-08-08 MED ORDER — ACETAMINOPHEN 325 MG PO TABS
650.0000 mg | ORAL_TABLET | Freq: Once | ORAL | Status: AC
  Administered 2016-08-08: 650 mg via ORAL
  Filled 2016-08-08: qty 2

## 2016-08-08 MED ORDER — ONDANSETRON HCL 4 MG/2ML IJ SOLN
4.0000 mg | Freq: Once | INTRAMUSCULAR | Status: AC
  Administered 2016-08-08: 4 mg via INTRAVENOUS
  Filled 2016-08-08: qty 2

## 2016-08-08 NOTE — ED Provider Notes (Signed)
[facility] Note   CSN: [id number] Arrival date & time: 08/08/16  1735  By signing my name below, I, Talbert NanPaul Grant, attest that this documentation has been prepared under the direction and in the presence of BoeingChris Yuliet Needs, PA-C. Electronically Signed: Talbert NanPaul Grant, Scribe. 08/08/16. 6:43 PM.   History   Chief Complaint Chief Complaint  Patient presents with  . Generalized Body Aches    HPI William Cooper is a 56 y.o. male with h/o appendectomy who presents to the Emergency Department complaining of diffuse, gradual onset, moderate-severe, persistent abdominal pain that began this morning around 11 hours ago. Pt denies increased frequency of urination, dysuria, nausea, vomiting, and diarrhea.Patient did not take any medications prior to arrival.  Patient states nothing seems make the condition better or worse   The history is provided by the patient. No language interpreter was used.    Past Medical History:  Diagnosis Date  . Alcohol abuse   . Substance abuse     Patient Active Problem List   Diagnosis Date Noted  . Major depression 05/14/2012  . Alcohol dependence (HCC) 05/14/2012    Past Surgical History:  Procedure Laterality Date  . APPENDECTOMY    . HIP OPEN REDUCTION Right   . JOINT REPLACEMENT         Home Medications    Prior to Admission medications   Medication Sig Start Date End Date Taking? Authorizing Provider  cloNIDine (CATAPRES) 0.1 MG tablet Take 1 tablet (0.1 mg total) by mouth at bedtime. 05/20/12   Charm RingsJamison Y Lord, NP  gabapentin (NEURONTIN) 300 MG capsule Take 1 capsule (300 mg total) by mouth 3 (three) times daily. 05/20/12   Charm RingsJamison Y Lord, NP  gabapentin (NEURONTIN) 300 MG capsule Take 1 capsule (300 mg total) by mouth at bedtime. 05/20/12   Charm RingsJamison Y Lord, NP  risperiDONE (RISPERDAL) 0.5 MG tablet Take 1 tablet (0.5 mg total) by mouth at bedtime. 05/20/12   Charm RingsJamison Y Lord, NP  traZODone (DESYREL) 100 MG tablet Take 1 tablet  (100 mg total) by mouth at bedtime as needed for sleep. 05/20/12   Charm RingsJamison Y Lord, NP    Family History No family history on file.  Social History Social History  Substance Use Topics  . Smoking status: Former Smoker    Packs/day: 0.50    Types: Cigarettes  . Smokeless tobacco: Never Used  . Alcohol use No     Comment: Former ETOH     Allergies   Morphine and related   Review of Systems Review of Systems  Constitutional: Negative for fever.  Gastrointestinal: Positive for abdominal pain. Negative for diarrhea, nausea and vomiting.    A complete 10 system review of systems was obtained and all systems are negative except as noted in the HPI and PMH.    Physical Exam Updated Vital Signs BP 183/98 (BP Location: Right Arm)   Pulse 108   Temp 99.4 F (37.4 C) (Oral)   Resp 20   Ht 5\' 7"  (1.702 m)   Wt 225 lb (102.1 kg)   SpO2 98%   BMI 35.24 kg/m   Physical Exam  Constitutional: He is oriented to person, place, and time. He appears well-developed and well-nourished. No distress.  HENT:  Head: Normocephalic and atraumatic.  Mouth/Throat: Oropharynx is clear and moist.  Eyes: Pupils are equal, round, and reactive to light.  Neck: Normal range of motion. Neck supple.  Cardiovascular: Normal rate, regular rhythm and normal heart sounds.  Exam reveals  no gallop and no friction rub.   No murmur heard. Pulmonary/Chest: Effort normal and breath sounds normal. No respiratory distress. He has no wheezes.  Abdominal: Soft. Bowel sounds are normal. He exhibits no distension and no mass. There is tenderness. There is no rigidity, no rebound and no guarding. A hernia is present.  Musculoskeletal: Normal range of motion. He exhibits no edema.  Neurological: He is alert and oriented to person, place, and time. He exhibits normal muscle tone. Coordination normal.  Skin: Skin is warm and dry. Capillary refill takes less than 2 seconds. No rash noted. No erythema.  Psychiatric: He  has a normal mood and affect. His behavior is normal.  Nursing note and vitals reviewed.    ED Treatments / Results   DIAGNOSTIC STUDIES: Oxygen Saturation is 98% on room air, normal by my interpretation.    COORDINATION OF CARE: 6:41 PM Discussed treatment plan with pt at bedside and pt agreed to plan, which includes chest Xr, CT Renal Stone study, EKG.   Labs (all labs ordered are listed, but only abnormal results are displayed) Labs Reviewed - No data to display  EKG  EKG Interpretation None       Radiology No results found.  Procedures Procedures (including critical care time)  Medications Ordered in ED Medications  acetaminophen (TYLENOL) tablet 650 mg (650 mg Oral Given 08/08/16 1757)     Initial Impression / Assessment and Plan / ED Course  I have reviewed the triage vital signs and the nursing notes.  Pertinent labs & imaging results that were available during my care of the patient were reviewed by me and considered in my medical decision making (see chart for details).   patient will be treated for gastroenteritis.  His CT scan did not show any abnormalities.  His laboratory testing did not yield any significant abnormalities.  Patient is advised to slowly increase his fluid intake, rest as much possible.  Patient agrees the plan and all questions were answered    Final Clinical Impressions(s) / ED Diagnoses   Final diagnoses:  None    New Prescriptions New Prescriptions   No medications on file   I personally performed the services described in this documentation, which was scribed in my presence. The recorded information has been reviewed and is accurate.    Charlestine Night, PA-C 08/08/16 2125    Jerelyn Scott, MD 08/08/16 2127

## 2016-08-08 NOTE — Discharge Instructions (Signed)
Return here as needed.  Follow-up with your primary care doctor.  Slowly increase her fluid intake.  Your testing here tonight did not show any significant abnormalities

## 2016-08-08 NOTE — ED Notes (Signed)
Pt reports severe lower abd pain since this morning, radiating to his back. Pt reports 4 episodes of diarrhea with vomiting. Pt denies urinary symptoms.

## 2016-08-18 DIAGNOSIS — N400 Enlarged prostate without lower urinary tract symptoms: Secondary | ICD-10-CM | POA: Diagnosis not present

## 2016-08-18 DIAGNOSIS — E782 Mixed hyperlipidemia: Secondary | ICD-10-CM | POA: Diagnosis not present

## 2016-08-18 DIAGNOSIS — Z79899 Other long term (current) drug therapy: Secondary | ICD-10-CM | POA: Diagnosis not present

## 2016-08-18 DIAGNOSIS — M545 Low back pain: Secondary | ICD-10-CM | POA: Diagnosis not present

## 2016-08-18 DIAGNOSIS — E559 Vitamin D deficiency, unspecified: Secondary | ICD-10-CM | POA: Diagnosis not present

## 2016-08-18 DIAGNOSIS — R7303 Prediabetes: Secondary | ICD-10-CM | POA: Diagnosis not present

## 2016-08-22 DIAGNOSIS — Z1211 Encounter for screening for malignant neoplasm of colon: Secondary | ICD-10-CM | POA: Diagnosis not present

## 2016-09-05 DIAGNOSIS — Z01818 Encounter for other preprocedural examination: Secondary | ICD-10-CM | POA: Diagnosis not present

## 2016-09-05 DIAGNOSIS — Z1211 Encounter for screening for malignant neoplasm of colon: Secondary | ICD-10-CM | POA: Diagnosis not present

## 2016-09-05 DIAGNOSIS — K635 Polyp of colon: Secondary | ICD-10-CM | POA: Diagnosis not present

## 2016-09-08 DIAGNOSIS — Z79899 Other long term (current) drug therapy: Secondary | ICD-10-CM | POA: Diagnosis not present

## 2016-09-08 DIAGNOSIS — E782 Mixed hyperlipidemia: Secondary | ICD-10-CM | POA: Diagnosis not present

## 2016-09-08 DIAGNOSIS — E559 Vitamin D deficiency, unspecified: Secondary | ICD-10-CM | POA: Diagnosis not present

## 2016-09-08 DIAGNOSIS — M545 Low back pain: Secondary | ICD-10-CM | POA: Diagnosis not present

## 2016-09-08 DIAGNOSIS — E119 Type 2 diabetes mellitus without complications: Secondary | ICD-10-CM | POA: Diagnosis not present

## 2016-09-10 DIAGNOSIS — K635 Polyp of colon: Secondary | ICD-10-CM | POA: Diagnosis not present

## 2016-09-20 DIAGNOSIS — F419 Anxiety disorder, unspecified: Secondary | ICD-10-CM | POA: Diagnosis not present

## 2016-10-08 DIAGNOSIS — M25519 Pain in unspecified shoulder: Secondary | ICD-10-CM | POA: Diagnosis not present

## 2016-10-08 DIAGNOSIS — E782 Mixed hyperlipidemia: Secondary | ICD-10-CM | POA: Diagnosis not present

## 2016-10-08 DIAGNOSIS — E119 Type 2 diabetes mellitus without complications: Secondary | ICD-10-CM | POA: Diagnosis not present

## 2016-10-18 DIAGNOSIS — F419 Anxiety disorder, unspecified: Secondary | ICD-10-CM | POA: Diagnosis not present

## 2016-10-29 DIAGNOSIS — K635 Polyp of colon: Secondary | ICD-10-CM | POA: Diagnosis not present

## 2016-12-05 DIAGNOSIS — E119 Type 2 diabetes mellitus without complications: Secondary | ICD-10-CM | POA: Diagnosis not present

## 2016-12-05 DIAGNOSIS — Z79899 Other long term (current) drug therapy: Secondary | ICD-10-CM | POA: Diagnosis not present

## 2016-12-05 DIAGNOSIS — E782 Mixed hyperlipidemia: Secondary | ICD-10-CM | POA: Diagnosis not present

## 2016-12-05 DIAGNOSIS — M545 Low back pain: Secondary | ICD-10-CM | POA: Diagnosis not present

## 2016-12-05 DIAGNOSIS — E291 Testicular hypofunction: Secondary | ICD-10-CM | POA: Diagnosis not present

## 2017-01-23 ENCOUNTER — Encounter (HOSPITAL_BASED_OUTPATIENT_CLINIC_OR_DEPARTMENT_OTHER): Payer: Self-pay | Admitting: Emergency Medicine

## 2017-01-23 ENCOUNTER — Emergency Department (HOSPITAL_BASED_OUTPATIENT_CLINIC_OR_DEPARTMENT_OTHER)
Admission: EM | Admit: 2017-01-23 | Discharge: 2017-01-24 | Disposition: A | Discharge status: Home / Self Care | Payer: Medicare Other | Attending: Physician Assistant | Admitting: Physician Assistant

## 2017-01-23 DIAGNOSIS — W260XXA Contact with knife, initial encounter: Secondary | ICD-10-CM | POA: Insufficient documentation

## 2017-01-23 DIAGNOSIS — Y999 Unspecified external cause status: Secondary | ICD-10-CM | POA: Diagnosis not present

## 2017-01-23 DIAGNOSIS — S65212A Laceration of superficial palmar arch of left hand, initial encounter: Secondary | ICD-10-CM | POA: Diagnosis not present

## 2017-01-23 DIAGNOSIS — Y939 Activity, unspecified: Secondary | ICD-10-CM | POA: Diagnosis not present

## 2017-01-23 DIAGNOSIS — S61211A Laceration without foreign body of left index finger without damage to nail, initial encounter: Secondary | ICD-10-CM | POA: Insufficient documentation

## 2017-01-23 DIAGNOSIS — S6992XA Unspecified injury of left wrist, hand and finger(s), initial encounter: Secondary | ICD-10-CM | POA: Diagnosis present

## 2017-01-23 DIAGNOSIS — Y929 Unspecified place or not applicable: Secondary | ICD-10-CM | POA: Insufficient documentation

## 2017-01-23 NOTE — ED Notes (Signed)
Laceration to left index finger measures 2.5 cm and he laceration to his palm (close to his middle finger) measures 3.5 cm.  Left index finger bleeds moderately when he moves his finger.  Hand is wrapped with kerlix at this time.
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
## 2017-01-24 DIAGNOSIS — S61211A Laceration without foreign body of left index finger without damage to nail, initial encounter: Secondary | ICD-10-CM | POA: Diagnosis not present

## 2017-01-24 MED ORDER — LIDOCAINE HCL 1 % IJ SOLN
INTRAMUSCULAR | Status: AC
  Administered 2017-01-24: 10 mL
  Filled 2017-01-24: qty 10

## 2017-01-24 MED ORDER — TETANUS-DIPHTH-ACELL PERTUSSIS 5-2.5-18.5 LF-MCG/0.5 IM SUSP
0.5000 mL | Freq: Once | INTRAMUSCULAR | Status: AC
  Administered 2017-01-24: 0.5 mL via INTRAMUSCULAR
  Filled 2017-01-24: qty 0.5
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
## 2017-01-24 NOTE — ED Notes (Signed)
I entered the room to give d/c instructions, pt not present. Pt was seen by registration leaving. Asked to stay pt did not. Vitals were unable to be collected. PA Maczis is aware.

## 2017-01-24 NOTE — ED Notes (Signed)
ED Provider at bedside. 

## 2017-01-24 NOTE — Discharge Instructions (Signed)
Follow up with your doctor, an urgent care, or this Emergency Department for removal of your stitches in 7 days. Do not submerge the stitches in water for the first 24 hours. Take your pain medication as prescribed. Do not operate heavy machinery while on pain medication.  ° ° °TREATMENT  °Keep the wound clean and dry for the next 24 hours and leave the dressing in place. You may shower after 24 hours. Do not soak the area for long periods of times as in a bath until the sutures are removed. °After 24 hours you may remove the dressing and gently clean the laceration site with antibacterial soap and warm water 2 times a day. Pat dry with clean towel. Do not scrub. °Once the wound has healed, scarring can be minimized by covering the wound with sunscreen during the day for 1 full year. ° °SEEK MEDICAL CARE IF:  °You have redness, swelling, or increasing pain in the wound.  °You see a red line that goes away from the wound.  °You have yellowish-white fluid (pus) coming from the wound.  °You have a fever.  °You notice a bad smell coming from the wound or dressing.  °Your wound breaks open before or after sutures have been removed.  °You notice something coming out of the wound such as wood or glass.  °Your wound is on your hand or foot and you cannot move a finger or toe.  °Your pain is not controlled with prescribed medicine.  ° °If you did not receive a tetanus shot today because you thought you were up to date, but did not recall when your last one was given, make sure to check with your primary caregiver to determine if you need one. ° ° ° °

## 2017-03-25 DIAGNOSIS — D539 Nutritional anemia, unspecified: Secondary | ICD-10-CM | POA: Diagnosis not present

## 2017-03-25 DIAGNOSIS — M129 Arthropathy, unspecified: Secondary | ICD-10-CM | POA: Diagnosis not present

## 2017-03-25 DIAGNOSIS — M545 Low back pain: Secondary | ICD-10-CM | POA: Diagnosis not present

## 2017-03-25 DIAGNOSIS — E119 Type 2 diabetes mellitus without complications: Secondary | ICD-10-CM | POA: Diagnosis not present

## 2017-03-25 DIAGNOSIS — E782 Mixed hyperlipidemia: Secondary | ICD-10-CM | POA: Diagnosis not present

## 2017-03-25 DIAGNOSIS — Z79899 Other long term (current) drug therapy: Secondary | ICD-10-CM | POA: Diagnosis not present

## 2017-03-25 DIAGNOSIS — E291 Testicular hypofunction: Secondary | ICD-10-CM | POA: Diagnosis not present

## 2017-04-29 ENCOUNTER — Emergency Department (HOSPITAL_COMMUNITY)
Admission: EM | Admit: 2017-04-29 | Discharge: 2017-04-30 | Payer: Medicare Other | Attending: Emergency Medicine | Admitting: Emergency Medicine

## 2017-04-29 DIAGNOSIS — S79911A Unspecified injury of right hip, initial encounter: Secondary | ICD-10-CM | POA: Diagnosis not present

## 2017-04-29 DIAGNOSIS — M25551 Pain in right hip: Secondary | ICD-10-CM | POA: Insufficient documentation

## 2017-04-29 DIAGNOSIS — Z79899 Other long term (current) drug therapy: Secondary | ICD-10-CM | POA: Insufficient documentation

## 2017-04-29 DIAGNOSIS — Z87891 Personal history of nicotine dependence: Secondary | ICD-10-CM | POA: Diagnosis not present

## 2017-04-29 DIAGNOSIS — M545 Low back pain: Secondary | ICD-10-CM | POA: Diagnosis not present

## 2017-04-29 DIAGNOSIS — R52 Pain, unspecified: Secondary | ICD-10-CM

## 2017-04-29 DIAGNOSIS — M5441 Lumbago with sciatica, right side: Secondary | ICD-10-CM | POA: Insufficient documentation

## 2017-04-29 DIAGNOSIS — S299XXA Unspecified injury of thorax, initial encounter: Secondary | ICD-10-CM | POA: Diagnosis not present
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
## 2017-04-30 ENCOUNTER — Emergency Department (HOSPITAL_COMMUNITY): Payer: Medicare Other

## 2017-04-30 ENCOUNTER — Other Ambulatory Visit: Payer: Self-pay

## 2017-04-30 ENCOUNTER — Encounter (HOSPITAL_COMMUNITY): Payer: Self-pay

## 2017-04-30 DIAGNOSIS — M5441 Lumbago with sciatica, right side: Secondary | ICD-10-CM | POA: Diagnosis not present

## 2017-04-30 DIAGNOSIS — S299XXA Unspecified injury of thorax, initial encounter: Secondary | ICD-10-CM | POA: Diagnosis not present

## 2017-04-30 DIAGNOSIS — S79911A Unspecified injury of right hip, initial encounter: Secondary | ICD-10-CM | POA: Diagnosis not present

## 2017-04-30 DIAGNOSIS — M545 Low back pain: Secondary | ICD-10-CM | POA: Diagnosis not present

## 2017-04-30 MED ORDER — HYDROCODONE-ACETAMINOPHEN 5-325 MG PO TABS
1.0000 | ORAL_TABLET | Freq: Once | ORAL | Status: AC
  Administered 2017-04-30: 1 via ORAL
  Filled 2017-04-30: qty 1

## 2017-04-30 MED ORDER — GABAPENTIN 300 MG PO CAPS
300.0000 mg | ORAL_CAPSULE | Freq: Once | ORAL | Status: AC
  Administered 2017-04-30: 300 mg via ORAL
  Filled 2017-04-30: qty 1

## 2017-04-30 MED ORDER — NAPROXEN 500 MG PO TABS: 500.0000 mg | ORAL_TABLET | Freq: Two times a day (BID) | ORAL | 0 refills | Status: DC

## 2017-04-30 MED ORDER — GABAPENTIN 300 MG PO CAPS: 300.0000 mg | ORAL_CAPSULE | Freq: Two times a day (BID) | ORAL | 0 refills | Status: DC

## 2017-04-30 NOTE — ED Notes (Signed)
Pt. Able to ambulate but reports burning in right leg.
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
## 2018-09-11 IMAGING — DX DG CHEST 2V
2 series · 2 of 2 positions shown · non-contrast
Comparison: 06/06/2014

CLINICAL DATA: Chest pain

EXAM:
CHEST  2 VIEW

[chest pa]
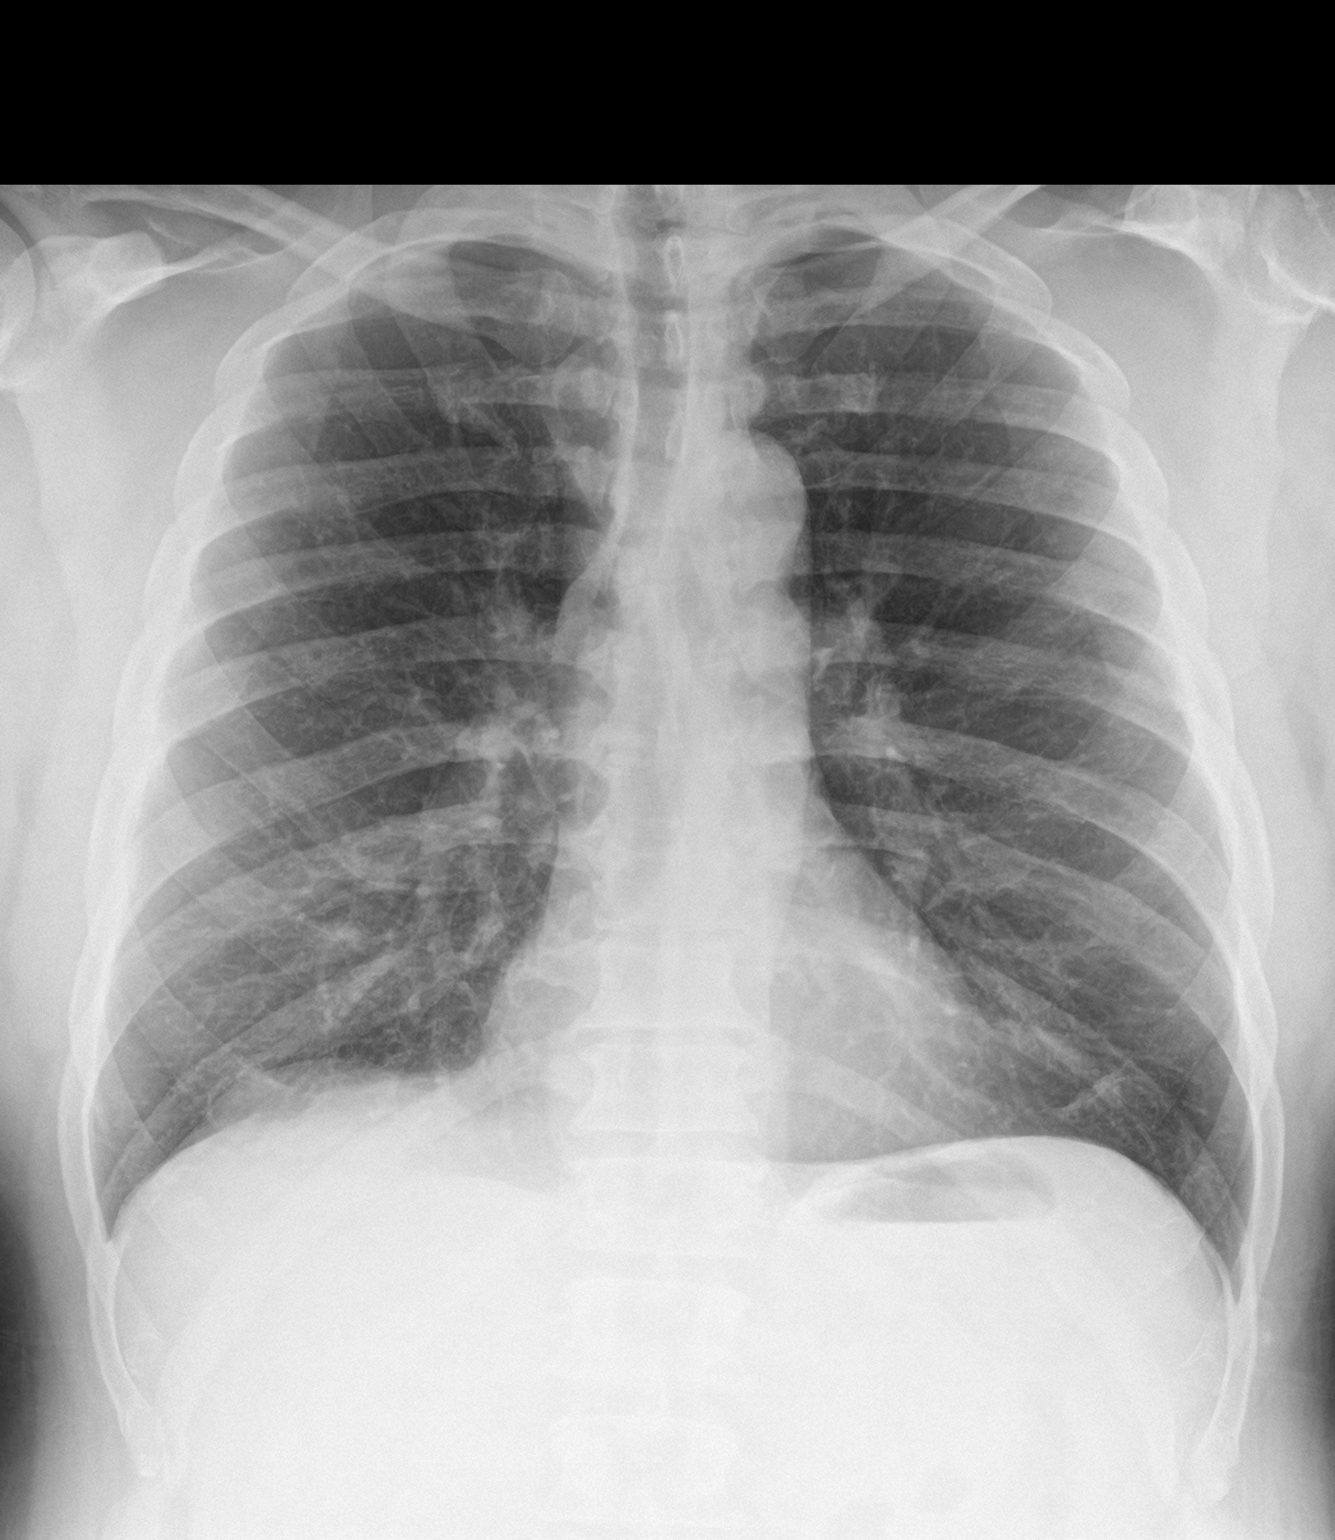

[chest lat]
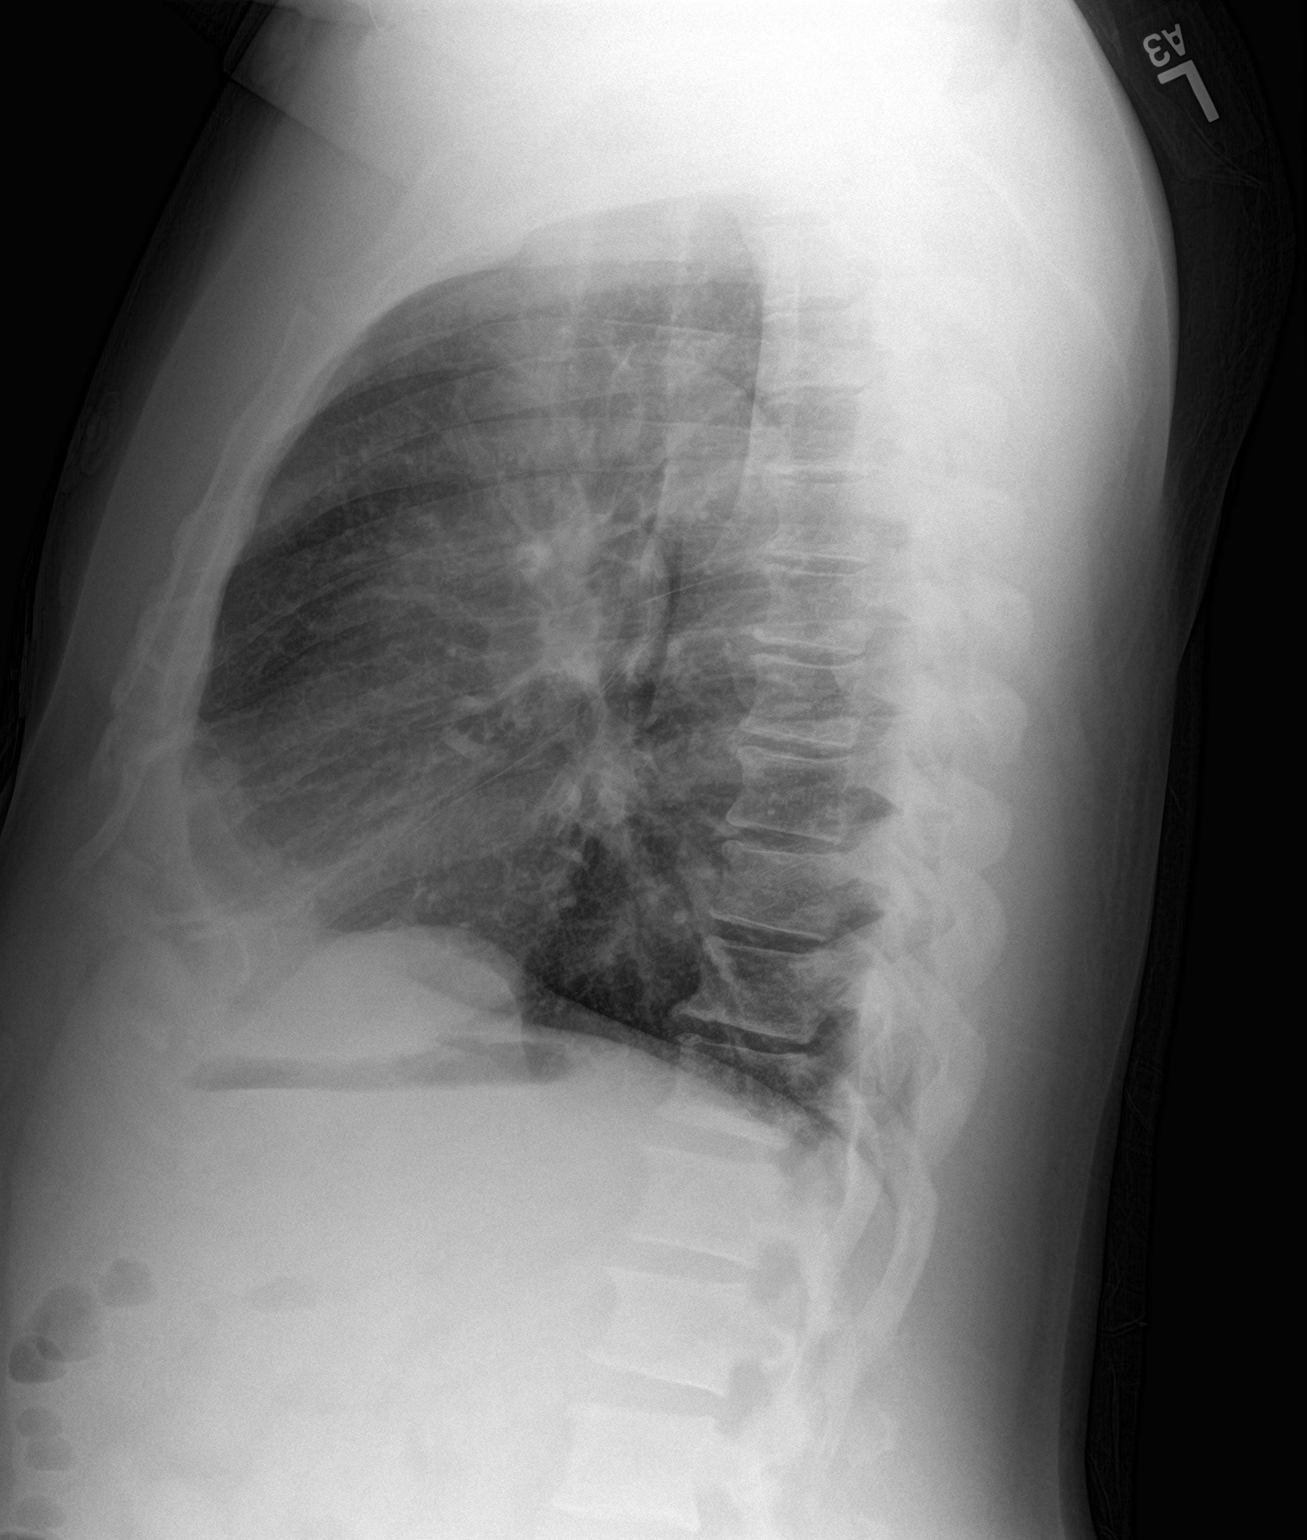

[2 of 2 positions shown; findings below may reference images not displayed]

FINDINGS: The heart size and mediastinal contours are within normal limits.
Both lungs are clear. The visualized skeletal structures are
unremarkable.
IMPRESSION: No active cardiopulmonary disease.

## 2022-09-09 ENCOUNTER — Emergency Department (HOSPITAL_COMMUNITY)
Admission: EM | Admit: 2022-09-09 | Discharge: 2022-09-09 | Disposition: A | Discharge status: Home / Self Care | Payer: 59 | Attending: Emergency Medicine | Admitting: Emergency Medicine

## 2022-09-09 ENCOUNTER — Other Ambulatory Visit: Payer: Self-pay

## 2022-09-09 ENCOUNTER — Emergency Department (HOSPITAL_COMMUNITY): Payer: 59

## 2022-09-09 ENCOUNTER — Encounter (HOSPITAL_COMMUNITY): Payer: Self-pay

## 2022-09-09 DIAGNOSIS — Z79899 Other long term (current) drug therapy: Secondary | ICD-10-CM | POA: Insufficient documentation

## 2022-09-09 DIAGNOSIS — I10 Essential (primary) hypertension: Secondary | ICD-10-CM | POA: Diagnosis not present

## 2022-09-09 DIAGNOSIS — M545 Low back pain, unspecified: Secondary | ICD-10-CM | POA: Diagnosis not present

## 2022-09-09 DIAGNOSIS — R109 Unspecified abdominal pain: Secondary | ICD-10-CM | POA: Diagnosis present

## 2022-09-09 DIAGNOSIS — R1084 Generalized abdominal pain: Secondary | ICD-10-CM

## 2022-09-09 LAB — CBC WITH DIFFERENTIAL/PLATELET
Abs Immature Granulocytes: 0.04 10*3/uL (ref 0.00–0.07)
Basophils Absolute: 0 10*3/uL (ref 0.0–0.1)
Basophils Relative: 1 %
Eosinophils Absolute: 0.2 10*3/uL (ref 0.0–0.5)
Eosinophils Relative: 4 %
HCT: 43.7 % (ref 39.0–52.0)
Hemoglobin: 14.6 g/dL (ref 13.0–17.0)
Immature Granulocytes: 1 %
Lymphocytes Relative: 38 %
Lymphs Abs: 2.5 10*3/uL (ref 0.7–4.0)
MCH: 29 pg (ref 26.0–34.0)
MCHC: 33.4 g/dL (ref 30.0–36.0)
MCV: 86.7 fL (ref 80.0–100.0)
Monocytes Absolute: 0.5 10*3/uL (ref 0.1–1.0)
Monocytes Relative: 8 %
Neutro Abs: 3.2 10*3/uL (ref 1.7–7.7)
Neutrophils Relative %: 48 %
Platelets: 198 10*3/uL (ref 150–400)
RBC: 5.04 MIL/uL (ref 4.22–5.81)
RDW: 14.1 % (ref 11.5–15.5)
WBC: 6.5 10*3/uL (ref 4.0–10.5)
nRBC: 0 % (ref 0.0–0.2)

## 2022-09-09 LAB — COMPREHENSIVE METABOLIC PANEL
ALT: 20 U/L (ref 0–44)
AST: 21 U/L (ref 15–41)
Albumin: 3.6 g/dL (ref 3.5–5.0)
Alkaline Phosphatase: 77 U/L (ref 38–126)
Anion gap: 7 (ref 5–15)
BUN: 10 mg/dL (ref 8–23)
CO2: 26 mmol/L (ref 22–32)
Calcium: 8.9 mg/dL (ref 8.9–10.3)
Chloride: 104 mmol/L (ref 98–111)
Creatinine, Ser: 1.1 mg/dL (ref 0.61–1.24)
GFR, Estimated: >60 mL/min (ref >60)
Glucose, Bld: 105 mg/dL — ABNORMAL HIGH (ref 70–99)
Potassium: 3.5 mmol/L (ref 3.5–5.1)
Sodium: 137 mmol/L (ref 135–145)
Total Bilirubin: 0.6 mg/dL (ref 0.3–1.2)
Total Protein: 6.8 g/dL (ref 6.5–8.1)

## 2022-09-09 LAB — RAPID URINE DRUG SCREEN, HOSP PERFORMED
Amphetamines: NOT DETECTED
Barbiturates: NOT DETECTED
Benzodiazepines: NOT DETECTED
Cocaine: NOT DETECTED
Opiates: NOT DETECTED
Tetrahydrocannabinol: NOT DETECTED

## 2022-09-09 LAB — URINALYSIS, ROUTINE W REFLEX MICROSCOPIC
Bilirubin Urine: NEGATIVE
Glucose, UA: 50 mg/dL — AB
Ketones, ur: NEGATIVE mg/dL
Nitrite: NEGATIVE
Protein, ur: NEGATIVE mg/dL
Specific Gravity, Urine: 1.025 (ref 1.005–1.030)
pH: 5 (ref 5.0–8.0)

## 2022-09-09 LAB — ETHANOL: Alcohol, Ethyl (B): <10 mg/dL (ref <10)

## 2022-09-09 LAB — LIPASE, BLOOD: Lipase: 28 U/L (ref 11–51)

## 2022-09-09 MED ORDER — OXYCODONE HCL 5 MG PO TABS: 5.0000 mg | ORAL_TABLET | Freq: Four times a day (QID) | ORAL | 0 refills | Status: DC | PRN

## 2022-09-09 MED ORDER — ONDANSETRON HCL 4 MG/2ML IJ SOLN
4.0000 mg | Freq: Once | INTRAMUSCULAR | Status: AC
  Administered 2022-09-09: 4 mg via INTRAVENOUS
  Filled 2022-09-09: qty 2

## 2022-09-09 MED ORDER — IBUPROFEN 400 MG PO TABS
400.0000 mg | ORAL_TABLET | Freq: Once | ORAL | Status: AC
  Administered 2022-09-09: 400 mg via ORAL
  Filled 2022-09-09: qty 1

## 2022-09-09 MED ORDER — LIDOCAINE 5 % EX PTCH: 1.0000 | MEDICATED_PATCH | CUTANEOUS | 0 refills | Status: DC

## 2022-09-09 MED ORDER — LIDOCAINE 5 % EX PTCH
1.0000 | MEDICATED_PATCH | Freq: Once | CUTANEOUS | Status: DC
  Administered 2022-09-09: 1 via TRANSDERMAL
  Filled 2022-09-09: qty 1

## 2022-09-09 MED ORDER — OXYCODONE-ACETAMINOPHEN 5-325 MG PO TABS
1.0000 | ORAL_TABLET | Freq: Once | ORAL | Status: AC
  Administered 2022-09-09: 1 via ORAL
  Filled 2022-09-09: qty 1

## 2022-09-09 MED ORDER — FENTANYL CITRATE PF 50 MCG/ML IJ SOSY
100.0000 ug | PREFILLED_SYRINGE | Freq: Once | INTRAMUSCULAR | Status: AC
  Administered 2022-09-09: 100 ug via INTRAVENOUS
  Filled 2022-09-09: qty 2

## 2022-09-09 MED ORDER — IOHEXOL 350 MG/ML SOLN
75.0000 mL | Freq: Once | INTRAVENOUS | Status: AC | PRN
  Administered 2022-09-09: 75 mL via INTRAVENOUS

## 2022-09-09 MED ORDER — FENTANYL CITRATE PF 50 MCG/ML IJ SOSY
50.0000 ug | PREFILLED_SYRINGE | Freq: Once | INTRAMUSCULAR | Status: AC
  Administered 2022-09-09: 50 ug via INTRAVENOUS
  Filled 2022-09-09: qty 1

## 2022-09-09 NOTE — ED Provider Notes (Signed)
[place] Provider Note   CSN: [id number] Arrival date & time: 09/09/22  [id number]     History  Chief Complaint  Patient presents with   Back Pain   Abdominal Pain    William Cooper is a 62 y.o. male.  The history is provided by the patient.  Patient presents with abdominal pain and back pain.  Patient reports around 3 days ago he started having pain in his low back that radiates into his abdomen.  No fevers or vomiting.  He does report some loose stools, but no incontinence.  He also reports dysuria but no urinary incontinence.  He reports tonight the pain worsened and he had difficulty moving due to pain.  No new leg weakness or numbness.  No chest pain or shortness of breath.  No recent trauma or falls.  No previous back surgery.  Reports previous appendectomy   Patient arrives with a back brace in place that has a heating pad   Denies any recent alcohol use.  Denies any recent NSAID use Past Medical History:  Diagnosis Date   Alcohol abuse    Substance abuse ([place])     Home Medications Prior to Admission medications   Medication Sig Start Date End Date Taking? Authorizing Provider  cloNIDine (CATAPRES) 0.1 MG tablet Take 1 tablet (0.1 mg total) by mouth at bedtime. 05/20/12   Patrecia Pour, NP  gabapentin (NEURONTIN) 300 MG capsule Take 1 capsule (300 mg total) by mouth 3 (three) times daily. 05/20/12   Patrecia Pour, NP  gabapentin (NEURONTIN) 300 MG capsule Take 1 capsule (300 mg total) by mouth at bedtime. 05/20/12   Patrecia Pour, NP  gabapentin (NEURONTIN) 300 MG capsule Take 1 capsule (300 mg total) 2 (two) times daily by mouth. 04/30/17   Horton, Barbette Hair, MD  risperiDONE (RISPERDAL) 0.5 MG tablet Take 1 tablet (0.5 mg total) by mouth at bedtime. 05/20/12   Patrecia Pour, NP  traZODone (DESYREL) 100 MG tablet Take 1 tablet (100 mg total) by mouth at bedtime as needed for sleep. 05/20/12   Patrecia Pour, NP       Allergies    Morphine and related    Review of Systems   Review of Systems  Cardiovascular:  Negative for chest pain.  Genitourinary:  Positive for dysuria.  Musculoskeletal:  Positive for back pain.    Physical Exam Updated Vital Signs BP (!) 145/87   Pulse 63   Temp 97.9 F (36.6 C) (Oral)   Resp 16   Ht 1.702 m (5\' 7" )   Wt 81.6 kg   SpO2 100%   BMI 28.19 kg/m  Physical Exam CONSTITUTIONAL: Elderly, uncomfortable appearing HEAD: Normocephalic/atraumatic EYES: EOMI/PERRL ENMT: Mucous membranes moist NECK: supple no meningeal signs SPINE/BACK: Diffuse lumbar tenderness No bruising/crepitance/stepoffs noted to spine CV: S1/S2 noted, no murmurs/rubs/gallops noted LUNGS: Lungs are clear to auscultation bilaterally, no apparent distress ABDOMEN: soft, diffuse moderate abdominal tenderness, no rebound or guarding GU: No inguinal hernia, no scrotal tenderness, testicles are send bilaterally Nurse present for exam NEURO: Awake/alert, no saddle anesthesia, rectal tone present (chaperone present), equal motor 5/5 strength noted with the following: hip flexion/knee flexion/extension, foot dorsi/plantar flexion, no sensory deficit in any dermatome.  EXTREMITIES: pulses normal in both lower extremities, full ROM SKIN: warm, color normal PSYCH: no abnormalities of mood noted, alert and oriented to situation   ED Results / Procedures / Treatments   Labs (all labs ordered  are listed, but only abnormal results are displayed) Labs Reviewed  COMPREHENSIVE METABOLIC PANEL - Abnormal; Notable for the following components:      Result Value   Glucose, Bld 105 (*)    All other components within normal limits  URINALYSIS, ROUTINE W REFLEX MICROSCOPIC - Abnormal; Notable for the following components:   Glucose, UA 50 (*)    Hgb urine dipstick SMALL (*)    Leukocytes,Ua TRACE (*)    Bacteria, UA FEW (*)    All other components within normal limits  LIPASE, BLOOD  CBC WITH  DIFFERENTIAL/PLATELET  ETHANOL  RAPID URINE DRUG SCREEN, HOSP PERFORMED    EKG EKG Interpretation  Date/Time:  Sunday September 09 2022 05:21:47 EDT Ventricular Rate:  77 PR Interval:  171 QRS Duration: 91 QT Interval:  362 QTC Calculation: 410 R Axis:   86 Text Interpretation: Sinus rhythm Anteroseptal infarct, age indeterminate Interpretation limited secondary to artifact No significant change since last tracing Confirmed by Ripley Fraise [phone]) on 09/09/2022 5:28:40 AM  Radiology No results found.  Procedures Procedures    Medications Ordered in ED Medications  fentaNYL (SUBLIMAZE) injection 100 mcg (100 mcg Intravenous Given 09/09/22 0546)  ondansetron (ZOFRAN) injection 4 mg (4 mg Intravenous Given 09/09/22 0543)  fentaNYL (SUBLIMAZE) injection 50 mcg (50 mcg Intravenous Given 09/09/22 [id number])    ED Course/ Medical Decision Making/ A&P Clinical Course as of 09/09/22 0655  Nancy Fetter Sep 09, 2022  0536 Patient presents with abdominal pain and back pain.  He is a poor historian.  Reports he is never had this type of back pain before although his previous MRI imaging does not reveal lumbar spondylosis and foraminal stenosis.  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  Patient appears uncomfortable with diffuse abdominal pain and back pain. He has no acute neurodeficits. Will treat pain and reassess.  Patient will likely require CT imaging [DW]  [phone] Patient is a poor historian, reporting back pain and abdominal pain.  Denies previous history of pancreatitis, but this is been documented at outside facilities.  Due to unclear history and reported back and abdominal pain, will proceed with CT imaging abdomen pelvis.  Patient requesting more pain meds [DW]  0654 Signed out to dr Maylon Peppers at shift change to f/u on CT imaging.  If negative he can be discharged with NSGY  followup [DW]    Clinical Course User Index [DW] Ripley Fraise, MD                             Medical Decision Making Amount and/or Complexity of Data Reviewed Labs: ordered. Radiology: ordered.  Risk Prescription drug management.   This patient presents to the ED for concern of back pain and abdominal pain, this involves an extensive number of treatment options, and is a complaint that carries with it a high risk of complications and morbidity.  The differential diagnosis includes but is not limited to cholecystitis, cholelithiasis, pancreatitis, gastritis, peptic ulcer disease, appendicitis, bowel obstruction, bowel perforation, diverticulitis, AAA, ischemic bowel Lumbosacral radiculopathy, epidural abscess, discitis, kidney stone, pyelonephritis    Comorbidities that complicate the patient evaluation: Patient's presentation is complicated by their history of hypertension  Social Determinants of Health: Patient's  history of substance use disorder   increases the complexity of managing their presentation  Additional history obtained: Records reviewed Care Everywhere/External Records  Lab  Tests: I Ordered, and personally interpreted labs.  The pertinent results include: Labs overall unremarkable   Cardiac Monitoring: The patient was maintained on a cardiac monitor.  I personally viewed and interpreted the cardiac monitor which showed an underlying rhythm of:  sinus rhythm  Medicines ordered and prescription drug management: I ordered medication including fentanyl for pain Reevaluation of the patient after these medicines showed that the patient    stayed the same   Reevaluation: After the interventions noted above, I reevaluated the patient and found that they have :stayed the same  Complexity of problems addressed: Patient's presentation is most consistent with  acute presentation with potential threat to life or bodily function           Final Clinical  Impression(s) / ED Diagnoses Final diagnoses:  Generalized abdominal pain  Acute midline low back pain without sciatica    Rx / DC Orders ED Discharge Orders     None         Ripley Fraise, MD 09/09/22 [phone]

## 2022-09-09 NOTE — ED Triage Notes (Signed)
Pt BIB [facility] from home, pt endorses lower back pain that radiates to abd area. Pt states it feels like "lightening bolts" in stomach front to back. Pt sts this has been going on for 3 days. Pt arrives with back brace in place.

## 2022-09-09 NOTE — Discharge Instructions (Addendum)
Continue to take Tylenol and Motrin as needed for pain both can be taken up to every 6 hours as needed.  You can use the lidocaine patches, ice or heat.  You can take oxycodone as needed for breakthrough pain but this makes you drowsy so do not take this if you will be driving, operating heavy machinery, working or watching small children alone.  You should follow-up in the neurosurgery spine clinic for further management of your low back pain.  SEEK IMMEDIATE MEDICAL ATTENTION IF: New numbness, tingling, weakness, or problem with the use of your arms or legs.  Severe back pain not relieved with medications.  Change in bowel or bladder control (if you lose control of stool or urine, or if you are unable to urinate) Increasing pain in any areas of the body (such as chest or abdominal pain).  Shortness of breath, dizziness or fainting.  Nausea (feeling sick to your stomach), vomiting, fever, or sweats.

## 2022-09-09 NOTE — ED Provider Notes (Signed)
Patient signed out to me at 07 100 by Dr. Christy Gentles pending CT.  In short this is a 62 year old male presenting to the emergency department with low back pain and mild diffuse abdominal pain.  Patient was initially evaluated and had no focal neurologic deficits and no signs or symptoms concerning for cauda equina or other spinal cord compression.  Patient's labs are within normal range.  Patient will have CT imaging to further evaluate for an intra-abdominal cause of his back pain.  He has had prior MRIs in the past.  Upon my evaluation, the patient is awake alert resting in bed comfortably no acute distress.  He does have significant pain with sitting up and moving around.  He has no midline back tenderness, he has bilateral lumbar paraspinal muscle tenderness, worse on the right compared to the left.  He has 5 out of 5 strength and sensation intact in bilateral lower extremities.  The patient will be given additional Percocet, Motrin and lidocaine patch for his pain.  CT abdomen pelvis showed no acute abnormalities.  He is stable for discharge home with outpatient spine follow-up.   Kemper Durie, DO 09/09/22 [phone]

## 2022-12-21 ENCOUNTER — Encounter (HOSPITAL_COMMUNITY): Payer: Self-pay

## 2022-12-21 ENCOUNTER — Ambulatory Visit (INDEPENDENT_AMBULATORY_CARE_PROVIDER_SITE_OTHER): Payer: 59

## 2022-12-21 ENCOUNTER — Ambulatory Visit (HOSPITAL_COMMUNITY)
Admission: EM | Admit: 2022-12-21 | Discharge: 2022-12-21 | Disposition: A | Discharge status: Home / Self Care | Payer: 59

## 2022-12-21 DIAGNOSIS — S93602A Unspecified sprain of left foot, initial encounter: Secondary | ICD-10-CM

## 2022-12-21 MED ORDER — IBUPROFEN 600 MG PO TABS: 600.0000 mg | ORAL_TABLET | Freq: Four times a day (QID) | ORAL | 0 refills | Status: AC | PRN

## 2022-12-21 NOTE — ED Triage Notes (Signed)
Patient's left foot got twisted in the dog's chain and fell. Onset yesterday. Now having swelling and can not stand on it. No known previous injuries to the foot.

## 2022-12-21 NOTE — Discharge Instructions (Addendum)
Overall your x-ray is negative for any acute abnormalities. You have been provided an ace wrap and crutches.  You have been prescribed [employer] 600 mg for the pain and swelling.  We do encourage you to follow up with your primary care provider if your symptoms persist for further

## 2022-12-21 NOTE — ED Provider Notes (Signed)
[facility]    CSN: [id number] Arrival date & time: 12/21/22  1022      History   Chief Complaint Chief Complaint  Patient presents with   Foot Injury    HPI William Cooper is a 62 y.o. male.   HPI  He is in today with a caregiver complaining of left foot pain. He reports that he suffered a ground level fall after being caught in the dog's chain. He he is now having left foot pain and swelling with redness. He denies any recent injury to this foot. He is experiencing radiating pain up into his outer calf. He has taken some old Oxycodone that he had for back pain.  Past Medical History:  Diagnosis Date   Alcohol abuse    Substance abuse [facility])     Patient Active Problem List   Diagnosis Date Noted   Major depression 05/14/2012   Alcohol dependence (HCC) 05/14/2012    Past Surgical History:  Procedure Laterality Date   APPENDECTOMY     HIP OPEN REDUCTION Right    JOINT REPLACEMENT         Home Medications    Prior to Admission medications   Medication Sig Start Date End Date Taking? Authorizing Provider  atorvastatin (LIPITOR) 40 MG tablet Take by mouth. 03/24/18  Yes [provider]  FARXIGA 10 MG TABS tablet Take 10 mg by mouth daily. 11/21/22  Yes [provider]  ibuprofen (ADVIL) 600 MG tablet Take 1 tablet (600 mg total) by mouth every 6 (six) hours as needed. 12/21/22  Yes Barbette Merino, NP  lisinopril-hydrochlorothiazide (ZESTORETIC) 20-25 MG tablet Take 1 tablet by mouth daily. 08/11/18  Yes [provider]  sildenafil (VIAGRA) 50 MG tablet Take 50 mg by mouth daily as needed. 11/21/22  Yes [provider]    Family History History reviewed. No pertinent family history.  Social History Social History   Tobacco Use   Smoking status: Every Day    Packs/day: .5    Types: Cigarettes   Smokeless tobacco: Never  Vaping Use   Vaping Use: Never used  Substance Use Topics   Alcohol use: Yes    Comment:  states he had 3-4 shots tonight   Drug use: No    Types: Marijuana, Cocaine    Comment: former drug abuse     Allergies   Morphine and codeine   Review of Systems Review of Systems   Physical Exam Triage Vital Signs ED Triage Vitals  Enc Vitals Group     BP 12/21/22 1057 133/86     Pulse Rate 12/21/22 1057 73     Resp 12/21/22 1057 18     Temp 12/21/22 1057 98 F (36.7 C)     Temp Source 12/21/22 1057 Oral     SpO2 12/21/22 1057 97 %     Weight 12/21/22 1056 180 lb (81.6 kg)     Height 12/21/22 1056 5' 7.5" (1.715 m)     Head Circumference --      Peak Flow --      Pain Score 12/21/22 1054 8     Pain Loc --      Pain Edu? --      Excl. in GC? --    No data found.  Updated Vital Signs BP 133/86 (BP Location: Right Arm)   Pulse 73   Temp 98 F (36.7 C) (Oral)   Resp 18   Ht 5' 7.5" (1.715 m)  Wt 180 lb (81.6 kg)   SpO2 97%   BMI 27.78 kg/m   Visual Acuity Right Eye Distance:   Left Eye Distance:   Bilateral Distance:    Right Eye Near:   Left Eye Near:    Bilateral Near:     Physical Exam HENT:     Head: Normocephalic.  Cardiovascular:     Rate and Rhythm: Normal rate.  Pulmonary:     Effort: Pulmonary effort is normal.  Musculoskeletal:     Left ankle: Swelling present.     Right foot: Normal.     Left foot: Decreased range of motion. Swelling present.     Comments: WC in use  Neurological:     General: No focal deficit present.     Mental Status: He is alert and oriented to person, place, and time.      UC Treatments / Results  Labs (all labs ordered are listed, but only abnormal results are displayed) Labs Reviewed - No data to display  EKG   Radiology DG Foot Complete Left  Result Date: 12/21/2022 CLINICAL DATA:  Fall yesterday.  Pain and swelling. EXAM: LEFT FOOT - COMPLETE 3+ VIEW COMPARISON:  None Available. FINDINGS: No acute fracture or dislocation is identified. Joint spaces are preserved. Mild soft tissue swelling is  noted in the forefoot. IMPRESSION: No acute osseous abnormality. Electronically Signed   By: Sebastian Ache M.D.   On: 12/21/2022 11:44    Procedures Procedures (including critical care time)  Medications Ordered in UC Medications - No data to display  Initial Impression / Assessment and Plan / UC Course  I have reviewed the triage vital signs and the nursing notes.  Pertinent labs & imaging results that were available during my care of the patient were reviewed by me and considered in my medical decision making (see chart for details).     Foot pain Final Clinical Impressions(s) / UC Diagnoses   Final diagnoses:  Sprain of left foot, initial encounter     Discharge Instructions      Overall your x-ray is negative for any acute abnormalities. You have been provided an ace wrap and crutches.  You have been prescribed IBM 600 mg for the pain and swelling.  We do encourage you to follow up with your primary care provider if your symptoms persist for further      ED Prescriptions     Medication Sig Dispense Auth. Provider   ibuprofen (ADVIL) 600 MG tablet Take 1 tablet (600 mg total) by mouth every 6 (six) hours as needed. 30 tablet Barbette Merino, NP      PDMP not reviewed this encounter.   Thad Ranger [place], [place] 12/21/22 1155
